# Patient Record
Sex: Male | Born: 1965 | Race: Black or African American | Hispanic: No | Marital: Single | Smoking: Never smoker
Health system: Southern US, Community
[De-identification: ages and names within clinical notes are randomized; demographics above are authoritative.]

## PROBLEM LIST (undated history)

## (undated) DIAGNOSIS — Z8619 Personal history of other infectious and parasitic diseases: Secondary | ICD-10-CM

## (undated) DIAGNOSIS — F419 Anxiety disorder, unspecified: Secondary | ICD-10-CM

## (undated) DIAGNOSIS — K5190 Ulcerative colitis, unspecified, without complications: Secondary | ICD-10-CM

## (undated) DIAGNOSIS — R011 Cardiac murmur, unspecified: Secondary | ICD-10-CM

## (undated) DIAGNOSIS — R51 Headache: Secondary | ICD-10-CM

## (undated) DIAGNOSIS — F32A Depression, unspecified: Secondary | ICD-10-CM

## (undated) DIAGNOSIS — B20 Human immunodeficiency virus [HIV] disease: Secondary | ICD-10-CM

## (undated) DIAGNOSIS — N529 Male erectile dysfunction, unspecified: Secondary | ICD-10-CM

## (undated) DIAGNOSIS — E785 Hyperlipidemia, unspecified: Secondary | ICD-10-CM

## (undated) DIAGNOSIS — J302 Other seasonal allergic rhinitis: Secondary | ICD-10-CM

## (undated) DIAGNOSIS — F329 Major depressive disorder, single episode, unspecified: Secondary | ICD-10-CM

## (undated) DIAGNOSIS — I10 Essential (primary) hypertension: Secondary | ICD-10-CM

## (undated) DIAGNOSIS — R519 Headache, unspecified: Secondary | ICD-10-CM

## (undated) DIAGNOSIS — I639 Cerebral infarction, unspecified: Secondary | ICD-10-CM

## (undated) DIAGNOSIS — Z21 Asymptomatic human immunodeficiency virus [HIV] infection status: Secondary | ICD-10-CM

## (undated) HISTORY — DX: Headache, unspecified: R51.9

## (undated) HISTORY — DX: Depression, unspecified: F32.A

## (undated) HISTORY — DX: Human immunodeficiency virus (HIV) disease: B20

## (undated) HISTORY — PX: HERNIA REPAIR: SHX51

## (undated) HISTORY — DX: Hyperlipidemia, unspecified: E78.5

## (undated) HISTORY — DX: Headache: R51

## (undated) HISTORY — DX: Other seasonal allergic rhinitis: J30.2

## (undated) HISTORY — DX: Asymptomatic human immunodeficiency virus (hiv) infection status: Z21

## (undated) HISTORY — DX: Ulcerative colitis, unspecified, without complications: K51.90

## (undated) HISTORY — DX: Male erectile dysfunction, unspecified: N52.9

## (undated) HISTORY — DX: Personal history of other infectious and parasitic diseases: Z86.19

## (undated) HISTORY — DX: Cardiac murmur, unspecified: R01.1

## (undated) HISTORY — PX: INGUINAL HERNIA REPAIR: SUR1180

## (undated) HISTORY — DX: Anxiety disorder, unspecified: F41.9

## (undated) HISTORY — PX: ELBOW SURGERY: SHX618

## (undated) HISTORY — DX: Essential (primary) hypertension: I10

## (undated) HISTORY — DX: Major depressive disorder, single episode, unspecified: F32.9

## (undated) HISTORY — DX: Cerebral infarction, unspecified: I63.9

---

## 2004-08-29 DIAGNOSIS — B20 Human immunodeficiency virus [HIV] disease: Secondary | ICD-10-CM | POA: Insufficient documentation

## 2004-08-29 DIAGNOSIS — Z21 Asymptomatic human immunodeficiency virus [HIV] infection status: Secondary | ICD-10-CM | POA: Insufficient documentation

## 2012-05-29 DIAGNOSIS — I10 Essential (primary) hypertension: Secondary | ICD-10-CM | POA: Insufficient documentation

## 2012-05-29 DIAGNOSIS — K5190 Ulcerative colitis, unspecified, without complications: Secondary | ICD-10-CM | POA: Insufficient documentation

## 2014-03-15 DIAGNOSIS — Z8673 Personal history of transient ischemic attack (TIA), and cerebral infarction without residual deficits: Secondary | ICD-10-CM | POA: Insufficient documentation

## 2014-12-21 ENCOUNTER — Emergency Department: Payer: Self-pay | Admitting: Emergency Medicine

## 2015-10-09 DIAGNOSIS — E785 Hyperlipidemia, unspecified: Secondary | ICD-10-CM | POA: Insufficient documentation

## 2016-03-26 ENCOUNTER — Encounter: Payer: Self-pay | Admitting: Family Medicine

## 2016-03-26 ENCOUNTER — Ambulatory Visit (INDEPENDENT_AMBULATORY_CARE_PROVIDER_SITE_OTHER): Payer: Self-pay | Admitting: Family Medicine

## 2016-03-26 VITALS — BP 145/94 | HR 80 | Temp 98.8°F | Resp 16 | Ht 70.0 in | Wt 144.0 lb

## 2016-03-26 DIAGNOSIS — Z21 Asymptomatic human immunodeficiency virus [HIV] infection status: Secondary | ICD-10-CM

## 2016-03-26 DIAGNOSIS — B20 Human immunodeficiency virus [HIV] disease: Secondary | ICD-10-CM

## 2016-03-26 DIAGNOSIS — I10 Essential (primary) hypertension: Secondary | ICD-10-CM | POA: Diagnosis not present

## 2016-03-26 DIAGNOSIS — Z7689 Persons encountering health services in other specified circumstances: Secondary | ICD-10-CM

## 2016-03-26 DIAGNOSIS — K5190 Ulcerative colitis, unspecified, without complications: Secondary | ICD-10-CM | POA: Diagnosis not present

## 2016-03-26 DIAGNOSIS — F329 Major depressive disorder, single episode, unspecified: Secondary | ICD-10-CM

## 2016-03-26 DIAGNOSIS — M722 Plantar fascial fibromatosis: Secondary | ICD-10-CM

## 2016-03-26 DIAGNOSIS — R0789 Other chest pain: Secondary | ICD-10-CM

## 2016-03-26 DIAGNOSIS — G44229 Chronic tension-type headache, not intractable: Secondary | ICD-10-CM | POA: Diagnosis not present

## 2016-03-26 DIAGNOSIS — F32A Depression, unspecified: Secondary | ICD-10-CM

## 2016-03-26 DIAGNOSIS — F418 Other specified anxiety disorders: Secondary | ICD-10-CM | POA: Diagnosis not present

## 2016-03-26 DIAGNOSIS — Z7189 Other specified counseling: Secondary | ICD-10-CM

## 2016-03-26 DIAGNOSIS — E785 Hyperlipidemia, unspecified: Secondary | ICD-10-CM | POA: Diagnosis not present

## 2016-03-26 DIAGNOSIS — N529 Male erectile dysfunction, unspecified: Secondary | ICD-10-CM | POA: Insufficient documentation

## 2016-03-26 DIAGNOSIS — Z8673 Personal history of transient ischemic attack (TIA), and cerebral infarction without residual deficits: Secondary | ICD-10-CM | POA: Diagnosis not present

## 2016-03-26 DIAGNOSIS — F419 Anxiety disorder, unspecified: Secondary | ICD-10-CM

## 2016-03-26 DIAGNOSIS — R519 Headache, unspecified: Secondary | ICD-10-CM | POA: Insufficient documentation

## 2016-03-26 DIAGNOSIS — R51 Headache: Secondary | ICD-10-CM

## 2016-03-26 MED ORDER — NAPROXEN 375 MG PO TABS: 375.0000 mg | ORAL_TABLET | Freq: Two times a day (BID) | ORAL | Status: DC

## 2016-03-26 MED ORDER — CITALOPRAM HYDROBROMIDE 20 MG PO TABS: 20.0000 mg | ORAL_TABLET | Freq: Every day | ORAL | Status: AC

## 2016-03-26 MED ORDER — LOSARTAN POTASSIUM-HCTZ 50-12.5 MG PO TABS: 1.0000 | ORAL_TABLET | Freq: Every day | ORAL | Status: DC

## 2016-03-26 NOTE — Assessment & Plan Note (Signed)
Secondary prevention ASA and statin. No residual. Does not follow with neurology.

## 2016-03-26 NOTE — Assessment & Plan Note (Signed)
HA consistent with tension type. PRN aleve. Ideally with decrease with stress relief. Reviewed HA alarm symptoms. Recheck 1 mos.

## 2016-03-26 NOTE — Assessment & Plan Note (Signed)
Continue atorvastatin. Check lipid panel. Continue 325mg  aspirin for secondary prevention.

## 2016-03-26 NOTE — Assessment & Plan Note (Signed)
Elevated. Check CMET. Consider adding ACE for renal protection and better blood pressure control. Continue to check at home.

## 2016-03-26 NOTE — Progress Notes (Signed)
Subjective:    Patient ID: Brandon Mullins, male    DOB: [birth year], 50 y.o.   MRN: [id number]  HPI: Brandon Mullins is a 50 y.o. male presenting on 03/26/2016 for New Patient (Initial Visit); Hypertension; and Headache   HPI  Pt presents to establish care today. Previous care provider was Dr. Carroll [facility]. Other physicians include Dr. Sedalia Muta at [facility] in [place].   It has been 7 years since His last PCP visit. Records from previous provider will be requested and reviewed. Current medical problems include: HIV takes Sustiva-  once daily. Managed by [facility].  Personal history of TIA- 2 years ago on the way to work. L occipital and thalamic infarct. Takes aspirin  daily for prevention. Was placed on atorvastatin-  daily. No residual.  Hyperlipidemia: Goal LDL is 70. Taking atorvastatin daily.  Plantar fasciitis- painful stepping down in the AM.  HTN: Currently on amlodipine and HCTZ 12.5mg - on for 5 years. No chest pain or shortness of breath. Strong family history. Checks BP at home- avg at home 140/80-90. Gets HA when BP is high.  Frequent HA-  Frontal and occipital HA. Symptoms began 2-3 years ago. Getting HA more frequently. Job is stressful. Pain is described as ache and throb. No nausea or sensitivity to light/sound. Usually occur at the end of work day. Home treatment: Aleve- provides relief. HA occur 3x per week. Does not happen on weekends.  Depression: Mother passed in September. Stressful job. Stress has increased at work after mother's passing. Has been on citalopram since the strokes.   Seen in March at [facility] for chest pain. Having L sided chest pain radiating. [facility]- r/o MI.  Thought to be stress and fatigue. Had some pains yesterday. Occurred when walking. Felt a squeezing pain.  Health maintenance:  Works as Patent examiner at [employer] Last Colonoscopy- had one for UC- found benign polyps- 2011. Told to proceed with normal.  No family history of prostate cancer. Would like to  screen PSA.     Past Medical History  Diagnosis Date  . Frequent headaches   . Hyperlipidemia   . Hypertension   . History of chicken pox   . Stroke (HCC)     x2  . HIV infection (HCC)   . Erectile dysfunction   . Ulcerative colitis (HCC)   . Seasonal allergies    Social History   Social History  . Marital Status: Unknown    Spouse Name: N/A  . Number of Children: N/A  . Years of Education: N/A   Occupational History  . Not on file.   Social History Main Topics  . Smoking status: Never Smoker   . Smokeless tobacco: Not on file  . Alcohol Use: 0.0 oz/week    0 Standard drinks or equivalent per week     Comment: occasional  . Drug Use: No  . Sexual Activity: Not on file   Other Topics Concern  . Not on file   Social History Narrative  . No narrative on file   No family history on file. No current outpatient prescriptions on file prior to visit.   No current facility-administered medications on file prior to visit.    Review of Systems  Constitutional: Negative for fever and chills.  Respiratory: Negative for chest tightness, shortness of breath and wheezing.   Cardiovascular: Positive for chest pain. Negative for palpitations and leg swelling.  Gastrointestinal: Negative for nausea, vomiting and abdominal pain.  Endocrine: Negative.   Genitourinary:  Negative for dysuria, urgency, discharge, penile pain and testicular pain.  Musculoskeletal: Positive for arthralgias (heel pain). Negative for back pain and joint swelling.  Skin: Negative.   Neurological: Positive for headaches. Negative for dizziness, weakness and numbness.  Psychiatric/Behavioral: Positive for dysphoric mood. Negative for suicidal ideas and sleep disturbance.   Per HPI unless specifically indicated above     Objective:    BP 145/94 mmHg  Pulse 80  Temp(Src) 98.8 F (37.1 C) (Oral)  Resp 16  Ht 5\' 10"  (1.778 m)  Wt 144 lb (65.318 kg)  BMI 20.66 kg/m2  Wt Readings from Last 3  Encounters:  03/26/16 144 lb (65.318 kg)    Depression screen [facility] 03/26/2016 03/26/2016  Decreased Interest 0 0  Down, Depressed, Hopeless 2 0  PHQ - 2 Score 2 0  Altered sleeping 1 -  Tired, decreased energy 3 -  Change in appetite 0 -  Feeling bad or failure about yourself  2 -  Trouble concentrating 0 -  Moving slowly or fidgety/restless 1 -  Suicidal thoughts 0 -  PHQ-9 Score 9 -  Difficult doing work/chores Somewhat difficult -     Physical Exam  Constitutional: He is oriented to person, place, and time. He appears well-developed and well-nourished. No distress.  HENT:  Head: Normocephalic and atraumatic.  Neck: Neck supple. No thyromegaly present.  Cardiovascular: Normal rate, regular rhythm and normal heart sounds.  Exam reveals no gallop and no friction rub.   No murmur heard. Pulmonary/Chest: Effort normal and breath sounds normal. He has no wheezes.  Abdominal: Soft. Bowel sounds are normal. He exhibits no distension. There is no tenderness. There is no rebound.  Musculoskeletal: Normal range of motion. He exhibits no edema.       Left foot: There is tenderness (at plantar fascia insertion. ).  Neurological: He is alert and oriented to person, place, and time. He has normal reflexes.  Skin: Skin is warm and dry. No rash noted. No erythema.  Psychiatric: He has a normal mood and affect. His behavior is normal. Thought content normal.   No results found for this or any previous visit.    Assessment & Plan:   Problem List Items Addressed This Visit      Cardiovascular and Mediastinum   BP (high blood pressure)    Elevated. Check CMET. Consider adding ACE for renal protection and better blood pressure control. Continue to check at home.       Relevant Medications   amLODipine (NORVASC) 10 MG tablet   atorvastatin (LIPITOR) 20 MG tablet   hydrochlorothiazide (HYDRODIURIL) 12.5 MG tablet   aspirin EC 325 MG tablet   losartan-hydrochlorothiazide (HYZAAR) 50-12.5  MG tablet     Digestive   Ulcerative colitis (HCC)    Does not have a GI specialist. No symptoms in many years. Doing well with suppositories.         Nervous and Auditory   Tension headache, chronic    HA consistent with tension type. PRN aleve. Ideally with decrease with stress relief. Reviewed HA alarm symptoms. Recheck 1 mos.       Relevant Medications   amLODipine (NORVASC) 10 MG tablet   aspirin EC 325 MG tablet   citalopram (CELEXA) 20 MG tablet   naproxen (NAPROSYN) 375 MG tablet     Musculoskeletal and Integument   Plantar fasciitis    Gentle stretching and PRN naproxen. Consider PT is not improving.       Relevant Medications  naproxen (NAPROSYN) 375 MG tablet     Other   Human immunodeficiency virus (HIV) infection (HCC)    Managed by [facility]. Follows q6 mos.       Relevant Medications   EPZICOM 600-300 MG tablet   SUSTIVA 600 MG tablet   HLD (hyperlipidemia)    Continue atorvastatin. Check lipid panel. Continue  aspirin for secondary prevention.       Relevant Medications   amLODipine (NORVASC) 10 MG tablet   atorvastatin (LIPITOR) 20 MG tablet   hydrochlorothiazide (HYDRODIURIL) 12.5 MG tablet   aspirin EC 325 MG tablet   losartan-hydrochlorothiazide (HYZAAR) 50-12.5 MG tablet   Other Relevant Orders   Lipid Profile   History of ischemic stroke without residual deficits    Secondary prevention ASA and statin. No residual. Does not follow with neurology.       Anxiety and depression    Increase celexa dose to  daily. Recheck 1mos. Check TSH, B12, and vitamin D.       Relevant Medications   citalopram (CELEXA) 20 MG tablet   Other Relevant Orders   VITAMIN D 25 Hydroxy (Vit-D Deficiency, Fractures)   TSH   B12 and Folate Panel    Other Visit Diagnoses    Encounter to establish care    -  Primary    Chest pressure        ECG WNL. Cardiac issues r/o at [facility] in March. On Statin and aspirin. Pt to ER if occurs again. Consider cardiology  if recurrent.     Relevant Orders    EKG 12-Lead    Comprehensive metabolic panel       Meds ordered this encounter  Medications  . amLODipine (NORVASC) 10 MG tablet    Sig: Take 10 mg by mouth daily.    Refill:  11  . atorvastatin (LIPITOR) 20 MG tablet    Sig: Take 20 mg by mouth daily.    Refill:  3  . DISCONTD: citalopram (CELEXA) 10 MG tablet    Sig: Take 10 mg by mouth daily.    Refill:  3  . EPZICOM 600-300 MG tablet    Sig: Take 1 tablet by mouth daily.    Refill:  3  . hydrochlorothiazide (HYDRODIURIL) 12.5 MG tablet    Sig: Take 1 tablet by mouth daily.    Refill:  3  . SUSTIVA 600 MG tablet    Sig: Take 1 tablet by mouth daily.    Refill:  3  . hydrocortisone (ANUSOL-HC) 25 MG suppository    Sig: Place 1 suppository rectally 2 (two) times daily as needed.  . hydrOXYzine (ATARAX/VISTARIL) 25 MG tablet    Sig: Take 25 mg by mouth daily.  Marland Kitchen aspirin EC 325 MG tablet    Sig: Take 325 mg by mouth daily.  Marland Kitchen triamcinolone cream (KENALOG) 0.1 %    Sig: Apply 1 application topically 2 (two) times daily.  Marland Kitchen DISCONTD: fexofenadine-pseudoephedrine (ALLEGRA-D 24) 180-240 MG 24 hr tablet    Sig: Take 1 tablet by mouth daily as needed. Reported on 03/26/2016  . fluticasone (FLONASE) 50 MCG/ACT nasal spray    Sig: Place 2 sprays into the nose as needed.  Marland Kitchen DISCONTD: benzonatate (TESSALON) 100 MG capsule    Sig: Take 100 mg by mouth 3 (three) times daily as needed for cough.  . losartan-hydrochlorothiazide (HYZAAR) 50-12.5 MG tablet    Sig: Take 1 tablet by mouth daily.    Dispense:  30 tablet    Refill:  11    Order Specific Question:  Supervising Provider    Answer:  Janeann Forehand [[id number]]  . citalopram (CELEXA) 20 MG tablet    Sig: Take 1 tablet (20 mg total) by mouth daily.    Dispense:  30 tablet    Refill:  11    Order Specific Question:  Supervising Provider    Answer:  Janeann Forehand [phone]  . naproxen (NAPROSYN) 375 MG tablet    Sig: Take 1 tablet  (375 mg total) by mouth 2 (two) times daily with a meal.    Dispense:  30 tablet    Refill:  1    Order Specific Question:  Supervising Provider    Answer:  Janeann Forehand [[id number]]      Follow up plan: Return in about 4 weeks (around 04/23/2016) for BP.

## 2016-03-26 NOTE — Assessment & Plan Note (Signed)
Gentle stretching and PRN naproxen. Consider PT is not improving.

## 2016-03-26 NOTE — Assessment & Plan Note (Signed)
Increase celexa dose to 20mg  daily. Recheck 1mos. Check TSH, B12, and vitamin D.

## 2016-03-26 NOTE — Assessment & Plan Note (Signed)
Managed by [facility]. Follows q6 mos.

## 2016-03-26 NOTE — Patient Instructions (Addendum)
We will increase your blood pressure medication today.  I want you to take Losartan-HCTZ 50-12.5mg  once daily to help with your blood pressure. Continue to check it at home. Your goal blood pressure is 100/60 to 140/90 Work on low salt/sodium diet - goal <1.5gm (1,500mg ) per day. Eat a diet high in fruits/vegetables and whole grains.  Look into mediterranean and DASH diet. Goal activity is 13350min/wk of moderate intensity exercise.  This can be split into 30 minute chunks.  If you are not at this level, you can start with smaller 10-15 min increments and slowly build up activity. Look at www.heart.org for more resources Please seek immediate medical attention at ER or Urgent Care if you develop: Chest pain, pressure or tightness. Shortness of breath accompanied by nausea or diaphoresis Visual changes Numbness or tingling on one side of the body Facial droop Altered mental status Or any concerning symptoms.  Let's try increasing your dose of Celexa to 20mg  daily to help with stress. Just take 2 pills of current until bottle is empty.

## 2016-03-26 NOTE — Assessment & Plan Note (Signed)
Does not have a GI specialist. No symptoms in many years. Doing well with suppositories.

## 2016-04-08 LAB — COMPREHENSIVE METABOLIC PANEL
ALT: 21 IU/L (ref 0–44)
AST: 21 IU/L (ref 0–40)
Albumin/Globulin Ratio: 2 (ref 1.2–2.2)
Albumin: 4.6 g/dL (ref 3.5–5.5)
Alkaline Phosphatase: 78 IU/L (ref 39–117)
BUN/Creatinine Ratio: 10 (ref 9–20)
BUN: 12 mg/dL (ref 6–24)
Bilirubin Total: 0.2 mg/dL (ref 0.0–1.2)
CALCIUM: 9.8 mg/dL (ref 8.7–10.2)
CO2: 26 mmol/L (ref 18–29)
CREATININE: 1.2 mg/dL (ref 0.76–1.27)
Chloride: 101 mmol/L (ref 96–106)
GFR calc Af Amer: 82 mL/min/{1.73_m2} (ref >59)
GFR, EST NON AFRICAN AMERICAN: 71 mL/min/{1.73_m2} (ref >59)
GLOBULIN, TOTAL: 2.3 g/dL (ref 1.5–4.5)
Glucose: 78 mg/dL (ref 65–99)
Potassium: 4.3 mmol/L (ref 3.5–5.2)
Sodium: 145 mmol/L — ABNORMAL HIGH (ref 134–144)
Total Protein: 6.9 g/dL (ref 6.0–8.5)

## 2016-04-08 LAB — B12 AND FOLATE PANEL
Folate: 4 ng/mL (ref >3.0)
Vitamin B-12: 276 pg/mL (ref 211–946)

## 2016-04-08 LAB — LIPID PANEL
CHOL/HDL RATIO: 2.8 ratio (ref 0.0–5.0)
Cholesterol, Total: 171 mg/dL (ref 100–199)
HDL: 61 mg/dL (ref >39)
LDL CALC: 57 mg/dL (ref 0–99)
TRIGLYCERIDES: 263 mg/dL — AB (ref 0–149)
VLDL CHOLESTEROL CAL: 53 mg/dL — AB (ref 5–40)

## 2016-04-08 LAB — TSH: TSH: 1.18 u[IU]/mL (ref 0.450–4.500)

## 2016-04-08 LAB — VITAMIN D 25 HYDROXY (VIT D DEFICIENCY, FRACTURES): Vit D, 25-Hydroxy: 36.3 ng/mL (ref 30.0–100.0)

## 2016-04-22 ENCOUNTER — Ambulatory Visit (INDEPENDENT_AMBULATORY_CARE_PROVIDER_SITE_OTHER): Payer: BLUE CROSS/BLUE SHIELD | Admitting: Family Medicine

## 2016-04-22 ENCOUNTER — Encounter: Payer: Self-pay | Admitting: Family Medicine

## 2016-04-22 VITALS — BP 122/94 | HR 91 | Temp 98.9°F | Resp 16 | Ht 70.0 in | Wt 147.0 lb

## 2016-04-22 DIAGNOSIS — I10 Essential (primary) hypertension: Secondary | ICD-10-CM | POA: Diagnosis not present

## 2016-04-22 DIAGNOSIS — Z8673 Personal history of transient ischemic attack (TIA), and cerebral infarction without residual deficits: Secondary | ICD-10-CM | POA: Diagnosis not present

## 2016-04-22 DIAGNOSIS — R0789 Other chest pain: Secondary | ICD-10-CM | POA: Insufficient documentation

## 2016-04-22 DIAGNOSIS — J069 Acute upper respiratory infection, unspecified: Secondary | ICD-10-CM | POA: Diagnosis not present

## 2016-04-22 MED ORDER — OXYMETAZOLINE HCL 0.05 % NA SOLN: 2.0000 | Freq: Two times a day (BID) | NASAL | Status: DC

## 2016-04-22 MED ORDER — DM-GUAIFENESIN ER 30-600 MG PO TB12: 1.0000 | ORAL_TABLET | Freq: Two times a day (BID) | ORAL | Status: DC

## 2016-04-22 MED ORDER — CHLORPHEN-PE-ACETAMINOPHEN 2-5-325 MG PO TABS: 1.0000 | ORAL_TABLET | Freq: Four times a day (QID) | ORAL | Status: DC | PRN

## 2016-04-22 NOTE — Assessment & Plan Note (Signed)
Diastolic still out of goal- will increase HCTZ to 25mg  total before changing combo pill. Check K and BMET next visit. Recheck 1 mos.

## 2016-04-22 NOTE — Progress Notes (Signed)
Subjective:    Patient ID: Brandon Mullins, male    DOB: [birth year], 50 y.o.   MRN: [id number]  HPI: Brandon Mullins is a 50 y.o. male presenting on 04/22/2016 for Hypertension   HPI  NWG:NFAOZH Bp meds at home, Norvasc and hyzaar- checks BP at night. Elevated at night 130/90. No chest pain. Had episode of chest discomfort on recent cruise- L sided chest pain before eating. Took naproxen and it went away. Lasted about 15 minutes-pressure in the chest that radiated to the neck. Had similar episode in March- seen at [facility]. This is 3rd episode.  Drainage x4 days. Returned from cruise. Laryngitis. No congestion. No sinus pressure. Scratchy throat. Cough to clear throat- no productive cough. Home mucinex, day quil/nyquil- mild relief.  HA- tension last night relieved with naproxen and massage.  Doing better in increased dose of celexa. Feels his symptoms are much better controlled.   Past Medical History  Diagnosis Date  . Frequent headaches   . Hyperlipidemia   . Hypertension   . History of chicken pox   . Stroke (HCC)     x2  . HIV infection (HCC)   . Erectile dysfunction   . Ulcerative colitis (HCC)   . Seasonal allergies     Current Outpatient Prescriptions on File Prior to Visit  Medication Sig  . amLODipine (NORVASC) 10 MG tablet Take 10 mg by mouth daily.  Marland Kitchen aspirin EC 325 MG tablet Take 325 mg by mouth daily.  Marland Kitchen atorvastatin (LIPITOR) 20 MG tablet Take 20 mg by mouth daily.  . citalopram (CELEXA) 20 MG tablet Take 1 tablet (20 mg total) by mouth daily.  Marland Kitchen EPZICOM 600-300 MG tablet Take 1 tablet by mouth daily.  . fluticasone (FLONASE) 50 MCG/ACT nasal spray Place 2 sprays into the nose as needed.  . hydrochlorothiazide (HYDRODIURIL) 12.5 MG tablet Take 1 tablet by mouth daily.  . hydrocortisone (ANUSOL-HC) 25 MG suppository Place 1 suppository rectally 2 (two) times daily as needed.  . hydrOXYzine (ATARAX/VISTARIL) 25 MG tablet Take 25 mg by mouth daily.  Marland Kitchen  losartan-hydrochlorothiazide (HYZAAR) 50-12.5 MG tablet Take 1 tablet by mouth daily.  . naproxen (NAPROSYN) 375 MG tablet Take 1 tablet (375 mg total) by mouth 2 (two) times daily with a meal.  . SUSTIVA 600 MG tablet Take 1 tablet by mouth daily.  Marland Kitchen triamcinolone cream (KENALOG) 0.1 % Apply 1 application topically 2 (two) times daily.   No current facility-administered medications on file prior to visit.    Review of Systems  Constitutional: Negative for fever and chills.  HENT: Positive for congestion, postnasal drip and sore throat. Negative for dental problem, ear pain, hearing loss and trouble swallowing.   Respiratory: Negative for chest tightness, shortness of breath and wheezing.   Cardiovascular: Positive for chest pain. Negative for palpitations and leg swelling.  Gastrointestinal: Negative for nausea, vomiting and abdominal pain.  Endocrine: Negative.   Genitourinary: Negative for dysuria, urgency, discharge, penile pain and testicular pain.  Musculoskeletal: Negative for back pain, joint swelling and arthralgias.  Skin: Negative.   Neurological: Positive for headaches. Negative for dizziness, weakness and numbness.  Psychiatric/Behavioral: Negative for sleep disturbance and dysphoric mood.   Per HPI unless specifically indicated above     Objective:    BP 122/94 mmHg  Pulse 91  Temp(Src) 98.9 F (37.2 C) (Oral)  Resp 16  Ht 5\' 10"  (1.778 m)  Wt 147 lb (66.679 kg)  BMI 21.09 kg/m2  Wt Readings from  Last 3 Encounters:  04/22/16 147 lb (66.679 kg)  03/26/16 144 lb (65.318 kg)    Physical Exam  Constitutional: He is oriented to person, place, and time. He appears well-developed and well-nourished. No distress.  HENT:  Head: Normocephalic and atraumatic.  Right Ear: Hearing and tympanic membrane normal.  Left Ear: Hearing and tympanic membrane normal.  Nose: Mucosal edema present. Right sinus exhibits no maxillary sinus tenderness and no frontal sinus tenderness.  Left sinus exhibits no maxillary sinus tenderness and no frontal sinus tenderness.  Mouth/Throat: Uvula is midline and mucous membranes are normal. Posterior oropharyngeal erythema present.  Oropharyngeal erythema and post nasal drainage seen.   Neck: Neck supple. No thyromegaly present.  Cardiovascular: Normal rate, regular rhythm and normal heart sounds.  Exam reveals no gallop and no friction rub.   No murmur heard. Pulmonary/Chest: Effort normal and breath sounds normal. He has no wheezes.  Abdominal: Soft. Bowel sounds are normal. He exhibits no distension. There is no tenderness. There is no rebound.  Musculoskeletal: Normal range of motion. He exhibits no edema or tenderness.  Neurological: He is alert and oriented to person, place, and time. He has normal reflexes.  Skin: Skin is warm and dry. No rash noted. No erythema.  Psychiatric: He has a normal mood and affect. His behavior is normal. Thought content normal.   Results for orders placed or performed in visit on 03/26/16  Comprehensive metabolic panel  Result Value Ref Range   Glucose 78 65 - 99 mg/dL   BUN 12 6 - 24 mg/dL   Creatinine, Ser 1.61 0.76 - 1.27 mg/dL   GFR calc non Af Amer 71 >59 mL/min/1.73   GFR calc Af Amer 82 >59 mL/min/1.73   BUN/Creatinine Ratio 10 9 - 20   Sodium 145 (H) 134 - 144 mmol/L   Potassium 4.3 3.5 - 5.2 mmol/L   Chloride 101 96 - 106 mmol/L   CO2 26 18 - 29 mmol/L   Calcium 9.8 8.7 - 10.2 mg/dL   Total Protein 6.9 6.0 - 8.5 g/dL   Albumin 4.6 3.5 - 5.5 g/dL   Globulin, Total 2.3 1.5 - 4.5 g/dL   Albumin/Globulin Ratio 2.0 1.2 - 2.2   Bilirubin Total 0.2 0.0 - 1.2 mg/dL   Alkaline Phosphatase 78 39 - 117 IU/L   AST 21 0 - 40 IU/L   ALT 21 0 - 44 IU/L  Lipid Profile  Result Value Ref Range   Cholesterol, Total 171 100 - 199 mg/dL   Triglycerides 096 (H) 0 - 149 mg/dL   HDL 61 >04 mg/dL   VLDL Cholesterol Cal 53 (H) 5 - 40 mg/dL   LDL Calculated 57 0 - 99 mg/dL   Chol/HDL Ratio 2.8 0.0  - 5.0 ratio units  VITAMIN D 25 Hydroxy (Vit-D Deficiency, Fractures)  Result Value Ref Range   Vit D, 25-Hydroxy 36.3 30.0 - 100.0 ng/mL  TSH  Result Value Ref Range   TSH 1.180 0.450 - 4.500 uIU/mL  B12 and Folate Panel  Result Value Ref Range   Vitamin B-12 276 211 - 946 pg/mL   Folate 4.0 >3.0 ng/mL      Assessment & Plan:   Problem List Items Addressed This Visit      Cardiovascular and Mediastinum   BP (high blood pressure) - Primary    Diastolic still out of goal- will increase HCTZ to  total before changing combo pill. Check K and BMET next visit. Recheck 1 mos.  Other   History of ischemic stroke without residual deficits   Relevant Orders   Ambulatory referral to Cardiology   Chest tightness or pressure    Due to multiple episodes of CP- will have patient seen by cardiology given h/o stroke. Pt to ER if chest pain occurs again.       Relevant Orders   Ambulatory referral to Cardiology    Other Visit Diagnoses    Upper respiratory infection        Add flonase daily. Continue mucinex. Cautioned for sparing use of decongestants due to his HTN. Supportive care at home. Reviewed alarm symptoms. Return if not improving.     Relevant Medications    Chlorphen-Phenyleph-APAP 2-5-325 MG TABS    oxymetazoline (AFRIN NASAL SPRAY) 0.05 % nasal spray    dextromethorphan-guaiFENesin (MUCINEX DM) 30-600 MG 12hr tablet       Meds ordered this encounter  Medications  . Chlorphen-Phenyleph-APAP 2-5-325 MG TABS    Sig: Take 1 tablet by mouth every 6 (six) hours as needed.    Dispense:  80 each    Refill:  0    Order Specific Question:  Supervising Provider    Answer:  Janeann ForehandHAWKINS JR, JAMES H [phone][[id number]]  . oxymetazoline (AFRIN NASAL SPRAY) 0.05 % nasal spray    Sig: Place 2 sprays into both nostrils 2 (two) times daily. For 3 days only.    Dispense:  30 mL    Refill:  0    Order Specific Question:  Supervising Provider    Answer:  Janeann ForehandHAWKINS JR, JAMES H [[id number]]  .  dextromethorphan-guaiFENesin (MUCINEX DM) 30-600 MG 12hr tablet    Sig: Take 1 tablet by mouth 2 (two) times daily.    Dispense:  20 tablet    Refill:  0    Order Specific Question:  Supervising Provider    Answer:  Janeann ForehandHAWKINS JR, JAMES H [[id number]]      Follow up plan: Return in about 4 weeks (around 05/20/2016), or if symptoms worsen or fail to improve, for BP check..Marland Kitchen

## 2016-04-22 NOTE — Assessment & Plan Note (Signed)
Due to multiple episodes of CP- will have patient seen by cardiology given h/o stroke. Pt to ER if chest pain occurs again.

## 2016-04-22 NOTE — Patient Instructions (Addendum)
We will increase your HCTZ to a total of 25mg  once daily. Take your Hyazaar pill and the 12.5mg  HCTZ pill at home once daily.  We will recheck blood pressure in 4 weeks.   I think we should have cardiology take a look at you due to chest pain and pressure since it keeps occurring. I will place a referral today. IF you have chest pressure again- please be seen in ER.   Your symptoms are consistent with a viral upper respiratory infection. At this time there is no need for antibiotics.  If your symptoms persist for > 10 days or get better and than worsen please let me know. You may have a secondary bacterial infection.  You can use supportive care at home to help with your symptoms. I have sent Mucinex DM to your pharmacy to help break up the congestion and soothe your cough. You can takes this twice daily.   Honey is a natural cough suppressant- so add it to your tea in the morning.  If you have a humidifer, set that up in your bedroom at night. Use afrin or tylenol sinus as needed to dry up drainage and congestion. Take your flonase daily.

## 2016-05-10 ENCOUNTER — Encounter: Payer: Self-pay | Admitting: *Deleted

## 2016-05-10 ENCOUNTER — Ambulatory Visit: Payer: Self-pay | Admitting: Cardiology

## 2016-05-21 ENCOUNTER — Ambulatory Visit: Payer: BLUE CROSS/BLUE SHIELD | Admitting: Family Medicine

## 2016-07-13 ENCOUNTER — Encounter: Payer: Self-pay | Admitting: Family Medicine

## 2016-07-13 ENCOUNTER — Ambulatory Visit (INDEPENDENT_AMBULATORY_CARE_PROVIDER_SITE_OTHER): Payer: BLUE CROSS/BLUE SHIELD | Admitting: Family Medicine

## 2016-07-13 VITALS — BP 134/99 | HR 89 | Temp 98.6°F | Resp 16 | Ht 70.0 in | Wt 144.6 lb

## 2016-07-13 DIAGNOSIS — I10 Essential (primary) hypertension: Secondary | ICD-10-CM | POA: Diagnosis not present

## 2016-07-13 DIAGNOSIS — Z8673 Personal history of transient ischemic attack (TIA), and cerebral infarction without residual deficits: Secondary | ICD-10-CM

## 2016-07-13 DIAGNOSIS — G44221 Chronic tension-type headache, intractable: Secondary | ICD-10-CM

## 2016-07-13 DIAGNOSIS — R202 Paresthesia of skin: Secondary | ICD-10-CM

## 2016-07-13 DIAGNOSIS — R0789 Other chest pain: Secondary | ICD-10-CM | POA: Diagnosis not present

## 2016-07-13 LAB — BASIC METABOLIC PANEL WITH GFR
BUN: 15 mg/dL (ref 7–25)
CALCIUM: 9.7 mg/dL (ref 8.6–10.3)
CO2: 26 mmol/L (ref 20–31)
CREATININE: 0.99 mg/dL (ref 0.60–1.35)
Chloride: 101 mmol/L (ref 98–110)
GFR, Est African American: >89 mL/min (ref >=60)
GFR, Est Non African American: >89 mL/min (ref >=60)
GLUCOSE: 101 mg/dL — AB (ref 65–99)
Potassium: 4.7 mmol/L (ref 3.5–5.3)
Sodium: 138 mmol/L (ref 135–146)

## 2016-07-13 LAB — CBC WITH DIFFERENTIAL/PLATELET
BASOS ABS: 96 {cells}/uL (ref 0–200)
Basophils Relative: 1 %
EOS ABS: 192 {cells}/uL (ref 15–500)
Eosinophils Relative: 2 %
HEMATOCRIT: 42.9 % (ref 38.5–50.0)
HEMOGLOBIN: 14.7 g/dL (ref 13.2–17.1)
LYMPHS ABS: 2400 {cells}/uL (ref 850–3900)
Lymphocytes Relative: 25 %
MCH: 31.3 pg (ref 27.0–33.0)
MCHC: 34.3 g/dL (ref 32.0–36.0)
MCV: 91.5 fL (ref 80.0–100.0)
MPV: 9.8 fL (ref 7.5–12.5)
Monocytes Absolute: 960 cells/uL — ABNORMAL HIGH (ref 200–950)
Monocytes Relative: 10 %
NEUTROS ABS: 5952 {cells}/uL (ref 1500–7800)
Neutrophils Relative %: 62 %
Platelets: 338 10*3/uL (ref 140–400)
RBC: 4.69 MIL/uL (ref 4.20–5.80)
RDW: 14 % (ref 11.0–15.0)
WBC: 9.6 10*3/uL (ref 3.8–10.8)

## 2016-07-13 MED ORDER — TIZANIDINE HCL 2 MG PO TABS: 2.0000 mg | ORAL_TABLET | Freq: Two times a day (BID) | ORAL | 0 refills | Status: DC | PRN

## 2016-07-13 MED ORDER — KETOROLAC TROMETHAMINE 30 MG/ML IJ SOLN: 30.0000 mg | Freq: Once | INTRAMUSCULAR | Status: DC

## 2016-07-13 MED ORDER — PROPRANOLOL HCL ER 80 MG PO CP24: 80.0000 mg | ORAL_CAPSULE | Freq: Every day | ORAL | 11 refills | Status: DC

## 2016-07-13 MED ORDER — KETOROLAC TROMETHAMINE 60 MG/2ML IM SOLN
60.0000 mg | Freq: Once | INTRAMUSCULAR | Status: AC
  Administered 2016-07-13: 60 mg via INTRAMUSCULAR

## 2016-07-13 NOTE — Assessment & Plan Note (Signed)
Diastolics are elevated. Pt has only been taking losartan HCTZ at home. Stopped amlodipine. Will add inderal for BP control and help with HA.  Follow-up 1 week.

## 2016-07-13 NOTE — Assessment & Plan Note (Signed)
Headaches appear to be tension type and stress mediated. Toradol today in office. Trial of inderal and tizanidine to help with pain. Avoid overuse of NSAIDs at home. Headache diary encouraged. Continue ASA given pt history of stroke. Considering stroke history- alarm symptoms reviewed- none present today and close follow-up. Consider neurology if not improving. Consider CT scan if not improving.

## 2016-07-13 NOTE — Patient Instructions (Addendum)
Headache: We will try a toradol injectio today to help with headache.  Try tylenol only for headache at home or Excedrin tension headache at home.  We will also try a blood pressure medication call Propranolol to take once daily to help with headaches. I have also given you a medication call Tizanidine to take 1-2 tablets as needed daily to help with headache.  Please keep a headache diary- write down when it started, what triggered it, what foods you ate, lighting, etc.   GO to the ER if you develop: Numbness with your headache, facial droop, slurred speech, HA wakes you up from sleep, visual changes, chest pain, shortness of breath, or any concerning symptoms.  Please reschedule your cardiology appt: [phone]  We will follow-up in 1 week.

## 2016-07-13 NOTE — Progress Notes (Signed)
Subjective:    Patient ID: Brandon Mullins, male    DOB: 1966/11/24, 50 y.o.   MRN: [id number]  HPI: Brandon Mullins is a 50 y.o. male presenting on 07/13/2016 for Headache (exausted waking up at night feet burning at night )   HPI  Pt presents for follow-up of headaches. Headaches are occurring daily. Usually occur in a band around the top of the head. Migrate to the back of the head on the L side at the occiput. Throbbing pain. Occasional on and off dizziness. Does not track with headaches. Hand gets cold at the tips when he is tired. No weakness. No facial droop. Gets blurred vision before and during a headache. Pt does have a history of stroke in the past. Headaches are occurring right before he gets into work around 9 am. Continue throughout the day. Pain of headache escalates as he leaves work. When he works from home he does not have as many headaches. When headache is present- he is sensitive to bright light but can sit in a brightly lit office. No nausea with the headache- but doesn't feel like eating. Headache has not woken him up from sleep. He does report is he is not sleeping well.  Home treatment: Taking 2 aleve twice per day- most days of the week. Does help with pain for a little while.   Work stressors- have been pushing him to work and do more at his job. Working 60+ hours every week. Goes to work when he is not feeling well. Is feeling exhausted- pushing himself.  Missed his cardiology appt for chest pain.  He has not had any in past 3 weeks but does still have occasional chest pain. He has yet to reschedule. Did not go to the ER.   Past Medical History:  Diagnosis Date  . Erectile dysfunction   . Frequent headaches   . History of chicken pox   . HIV infection (HCC)   . Hyperlipidemia   . Hypertension   . Seasonal allergies   . Stroke (HCC)    x2  . Ulcerative colitis ([facility])     Current Outpatient Prescriptions on File Prior to Visit  Medication Sig  . aspirin EC 325 MG  tablet Take 325 mg by mouth daily.  Marland Kitchen. atorvastatin (LIPITOR) 20 MG tablet Take 20 mg by mouth daily.  . citalopram (CELEXA) 20 MG tablet Take 1 tablet (20 mg total) by mouth daily.  Marland Kitchen. EPZICOM 600-300 MG tablet Take 1 tablet by mouth daily.  . fluticasone (FLONASE) 50 MCG/ACT nasal spray Place 2 sprays into the nose as needed.  . hydrocortisone (ANUSOL-HC) 25 MG suppository Place 1 suppository rectally 2 (two) times daily as needed.  . hydrOXYzine (ATARAX/VISTARIL) 25 MG tablet Take 25 mg by mouth daily.  Marland Kitchen. losartan-hydrochlorothiazide (HYZAAR) 50-12.5 MG tablet Take 1 tablet by mouth daily.  Marland Kitchen. oxymetazoline (AFRIN NASAL SPRAY) 0.05 % nasal spray Place 2 sprays into both nostrils 2 (two) times daily. For 3 days only.  . SUSTIVA 600 MG tablet Take 1 tablet by mouth daily.  Marland Kitchen. triamcinolone cream (KENALOG) 0.1 % Apply 1 application topically 2 (two) times daily.   No current facility-administered medications on file prior to visit.     Review of Systems  Constitutional: Negative for chills and fever.  Respiratory: Negative for chest tightness, shortness of breath and wheezing.   Cardiovascular: Positive for chest pain. Negative for palpitations and leg swelling.  Gastrointestinal: Negative for abdominal pain, nausea and vomiting.  Endocrine: Negative.   Genitourinary: Negative for discharge, dysuria, penile pain, testicular pain and urgency.  Musculoskeletal: Negative for arthralgias, back pain and joint swelling.  Skin: Negative.   Neurological: Positive for dizziness, numbness (occasional. Finger tips. ) and headaches. Negative for seizures, syncope, facial asymmetry, speech difficulty, weakness and light-headedness.  Psychiatric/Behavioral: Negative for dysphoric mood and sleep disturbance.   Per HPI unless specifically indicated above     Objective:    BP (!) 134/99 (BP Location: Right Arm, Patient Position: Sitting, Cuff Size: Normal)   Pulse 89   Temp 98.6 F (37 C) (Oral)    Resp 16   Ht 5\' 10"  (1.778 m)   Wt 144 lb 9.6 oz (65.6 kg)   BMI 20.75 kg/m   Wt Readings from Last 3 Encounters:  07/13/16 144 lb 9.6 oz (65.6 kg)  04/22/16 147 lb (66.7 kg)  03/26/16 144 lb (65.3 kg)    Physical Exam  Constitutional: He is oriented to person, place, and time. He appears well-developed and well-nourished. No distress.  HENT:  Head: Normocephalic and atraumatic.  Eyes: Conjunctivae, EOM and lids are normal. Pupils are equal, round, and reactive to light. Right eye exhibits normal extraocular motion and no nystagmus. Left eye exhibits normal extraocular motion and no nystagmus. Pupils are equal.  Neck: Neck supple. No thyromegaly present.  Cardiovascular: Normal rate, regular rhythm and normal heart sounds.  Exam reveals no gallop and no friction rub.   No murmur heard. Pulmonary/Chest: Effort normal and breath sounds normal. He has no wheezes.  Abdominal: Soft. Bowel sounds are normal. He exhibits no distension. There is no tenderness. There is no rebound.  Musculoskeletal: Normal range of motion. He exhibits no edema or tenderness.  Neurological: He is alert and oriented to person, place, and time. He has normal strength and normal reflexes. No cranial nerve deficit or sensory deficit. He displays a negative Romberg sign. Coordination and gait normal.  Reflex Scores:      Patellar reflexes are 2+ on the right side and 2+ on the left side. Heel to shin intact. Finger to nose intact.   Skin: Skin is warm and dry. No rash noted. No erythema.  Psychiatric: He has a normal mood and affect. His behavior is normal. Thought content normal.   Results for orders placed or performed in visit on 03/26/16  Comprehensive metabolic panel  Result Value Ref Range   Glucose 78 65 - 99 mg/dL   BUN 12 6 - 24 mg/dL   Creatinine, Ser 1.61 0.76 - 1.27 mg/dL   GFR calc non Af Amer 71 >59 mL/min/1.73   GFR calc Af Amer 82 >59 mL/min/1.73   BUN/Creatinine Ratio 10 9 - 20   Sodium 145 (H)  134 - 144 mmol/L   Potassium 4.3 3.5 - 5.2 mmol/L   Chloride 101 96 - 106 mmol/L   CO2 26 18 - 29 mmol/L   Calcium 9.8 8.7 - 10.2 mg/dL   Total Protein 6.9 6.0 - 8.5 g/dL   Albumin 4.6 3.5 - 5.5 g/dL   Globulin, Total 2.3 1.5 - 4.5 g/dL   Albumin/Globulin Ratio 2.0 1.2 - 2.2   Bilirubin Total 0.2 0.0 - 1.2 mg/dL   Alkaline Phosphatase 78 39 - 117 IU/L   AST 21 0 - 40 IU/L   ALT 21 0 - 44 IU/L  Lipid Profile  Result Value Ref Range   Cholesterol, Total 171 100 - 199 mg/dL   Triglycerides 096 (H) 0 - 149 mg/dL  HDL 61 >39 mg/dL   VLDL Cholesterol Cal 53 (H) 5 - 40 mg/dL   LDL Calculated 57 0 - 99 mg/dL   Chol/HDL Ratio 2.8 0.0 - 5.0 ratio units  VITAMIN D 25 Hydroxy (Vit-D Deficiency, Fractures)  Result Value Ref Range   Vit D, 25-Hydroxy 36.3 30.0 - 100.0 ng/mL  TSH  Result Value Ref Range   TSH 1.180 0.450 - 4.500 uIU/mL  B12 and Folate Panel  Result Value Ref Range   Vitamin B-12 276 211 - 946 pg/mL   Folate 4.0 >3.0 ng/mL      Assessment & Plan:   Problem List Items Addressed This Visit      Cardiovascular and Mediastinum   BP (high blood pressure)    Diastolics are elevated. Pt has only been taking losartan HCTZ at home. Stopped amlodipine. Will add inderal for BP control and help with HA.  Follow-up 1 week.       Relevant Medications   propranolol ER (INDERAL LA) 80 MG 24 hr capsule   Other Relevant Orders   BASIC METABOLIC PANEL WITH GFR     Nervous and Auditory   Tension headache, chronic - Primary    Headaches appear to be tension type and stress mediated. Toradol today in office. Trial of inderal and tizanidine to help with pain. Avoid overuse of NSAIDs at home. Headache diary encouraged. Continue ASA given pt history of stroke. Considering stroke history- alarm symptoms reviewed- none present today and close follow-up. Consider neurology if not improving. Consider CT scan if not improving.       Relevant Medications   propranolol ER (INDERAL LA) 80 MG  24 hr capsule   tiZANidine (ZANAFLEX) 2 MG tablet   ketorolac (TORADOL) injection 60 mg (Completed)   Other Relevant Orders   CBC with Differential     Other   History of ischemic stroke without residual deficits    Continue ASA and statin for secondary prevention. Stroke symptoms reviewed with patient and he verbalized understanding of when to seek care.       Chest tightness or pressure    None present today but has occasionally still occurred. Given pt h/o stroke- have asked him to reschedule his cardiology appt for further evaluation. To ER with active chest pains.        Other Visit Diagnoses    Paresthesia of both hands       Does not occur with headache. may be stress or neck related. Will keep close follow-up. IF numbness worsens or continues, or appears with all headaches pt to ER      Meds ordered this encounter  Medications  . DISCONTD: ketorolac (TORADOL) 30 MG/ML injection 30 mg  . propranolol ER (INDERAL LA) 80 MG 24 hr capsule    Sig: Take 1 capsule (80 mg total) by mouth daily.    Dispense:  30 capsule    Refill:  11    Order Specific Question:   Supervising Provider    Answer:   Janeann ForehandHAWKINS JR, JAMES H [phone][[id number]]  . tiZANidine (ZANAFLEX) 2 MG tablet    Sig: Take 1 tablet (2 mg total) by mouth 2 (two) times daily as needed for muscle spasms.    Dispense:  60 tablet    Refill:  0    Order Specific Question:   Supervising Provider    Answer:   Janeann ForehandHAWKINS JR, JAMES H [phone][[id number]]  . ketorolac (TORADOL) injection 60 mg      Follow up plan: Return  in about 1 week (around 07/20/2016), or if symptoms worsen or fail to improve.

## 2016-07-13 NOTE — Assessment & Plan Note (Signed)
Continue ASA and statin for secondary prevention. Stroke symptoms reviewed with patient and he verbalized understanding of when to seek care.

## 2016-07-13 NOTE — Assessment & Plan Note (Signed)
None present today but has occasionally still occurred. Given pt h/o stroke- have asked him to reschedule his cardiology appt for further evaluation. To ER with active chest pains.

## 2016-07-22 ENCOUNTER — Encounter: Payer: Self-pay | Admitting: Family Medicine

## 2016-07-22 ENCOUNTER — Ambulatory Visit (INDEPENDENT_AMBULATORY_CARE_PROVIDER_SITE_OTHER): Payer: BLUE CROSS/BLUE SHIELD | Admitting: Family Medicine

## 2016-07-22 VITALS — BP 120/92 | HR 71 | Temp 98.6°F | Resp 16 | Ht 70.0 in | Wt 143.0 lb

## 2016-07-22 DIAGNOSIS — R531 Weakness: Secondary | ICD-10-CM | POA: Diagnosis not present

## 2016-07-22 DIAGNOSIS — Z8673 Personal history of transient ischemic attack (TIA), and cerebral infarction without residual deficits: Secondary | ICD-10-CM

## 2016-07-22 DIAGNOSIS — R51 Headache: Secondary | ICD-10-CM

## 2016-07-22 DIAGNOSIS — R0789 Other chest pain: Secondary | ICD-10-CM

## 2016-07-22 DIAGNOSIS — R519 Headache, unspecified: Secondary | ICD-10-CM

## 2016-07-22 MED ORDER — PROPRANOLOL HCL ER 120 MG PO CP24: 120.0000 mg | ORAL_CAPSULE | Freq: Every day | ORAL | 11 refills | Status: DC

## 2016-07-22 NOTE — Patient Instructions (Addendum)
I have made you an appt to Cardiology on- September 12 at 1pm with Dr. Alvino ChapelIngal. You are on the wait list to for cancellations. They will contact you if they have an earlier appt.   IF you continue to have chest pain- you need to be seen in the ER to rule out a heart attack.  We will schedule a CT scan of your head for the headaches. I am also increasing your Inderal 120mg  once daily to see if that helps with the headaches. Given your history of stroke and where the pain is occurring I would also like you to see neurology.   Please seek immediate medical attention at ER or Urgent Care if you develop: Chest pain, pressure or tightness. Shortness of breath accompanied by nausea or diaphoresis Visual changes Numbness or tingling on one side of the body Facial droop Altered mental status Or any concerning symptoms.

## 2016-07-22 NOTE — Progress Notes (Signed)
Subjective:    Patient ID: Brandon Mullins, male    DOB: 09-30-1966, 50 y.o.   MRN: [id number]  HPI: Brandon Mullins is a 50 y.o. male presenting on 07/22/2016 for Hand Pain (follow up not improved yesterday was having chest pain not sharp but was tightness as per pt not dizzy but was exchusted)   HPI Pt presents for follow-up of headaches. Headaches have lessened since starting the Inderal. Feeling tired/weak in the evenings. No associated numbness or tingling. Always on the L side of the head. Start at the occipital lobe and go up. Headaches occur daily. Headaches are not as intense as they used to be Taking exedrin tension headache seem to help. Zanaflex does not seem to help.  He also continues to have chest pain. Last episode was yesterday. He did not reschedule his cardiology appt. He did not seek care at the time of the chest pain. No active chest pain today. Chest pain is occurring at rest.  Described as tightness or pressure. Pain typically lasts 3 minutes. Pain is a 3/10.   Past Medical History:  Diagnosis Date  . Erectile dysfunction   . Frequent headaches   . History of chicken pox   . HIV infection ([facility])   . Hyperlipidemia   . Hypertension   . Seasonal allergies   . Stroke ([facility])    x2  . Ulcerative colitis ([facility])     Current Outpatient Prescriptions on File Prior to Visit  Medication Sig  . aspirin EC 325 MG tablet Take 325 mg by mouth daily.  Marland Kitchen atorvastatin (LIPITOR) 20 MG tablet Take 20 mg by mouth daily.  . citalopram (CELEXA) 20 MG tablet Take 1 tablet (20 mg total) by mouth daily.  Marland Kitchen EPZICOM 600-300 MG tablet Take 1 tablet by mouth daily.  . fluticasone (FLONASE) 50 MCG/ACT nasal spray Place 2 sprays into the nose as needed.  . hydrocortisone (ANUSOL-HC) 25 MG suppository Place 1 suppository rectally 2 (two) times daily as needed.  . hydrOXYzine (ATARAX/VISTARIL) 25 MG tablet Take 25 mg by mouth daily.  Marland Kitchen losartan-hydrochlorothiazide (HYZAAR) 50-12.5 MG tablet Take 1  tablet by mouth daily.  Marland Kitchen oxymetazoline (AFRIN NASAL SPRAY) 0.05 % nasal spray Place 2 sprays into both nostrils 2 (two) times daily. For 3 days only.  . SUSTIVA 600 MG tablet Take 1 tablet by mouth daily.  Marland Kitchen tiZANidine (ZANAFLEX) 2 MG tablet Take 1 tablet (2 mg total) by mouth 2 (two) times daily as needed for muscle spasms.  Marland Kitchen triamcinolone cream (KENALOG) 0.1 % Apply 1 application topically 2 (two) times daily.   No current facility-administered medications on file prior to visit.     Review of Systems  Constitutional: Positive for fatigue. Negative for chills and fever.  Respiratory: Negative for chest tightness, shortness of breath and wheezing.   Cardiovascular: Positive for chest pain. Negative for palpitations and leg swelling.  Gastrointestinal: Negative for abdominal pain, nausea and vomiting.  Endocrine: Negative.   Genitourinary: Negative for discharge, dysuria, penile pain, testicular pain and urgency.  Musculoskeletal: Negative for arthralgias, back pain and joint swelling.  Skin: Negative.   Neurological: Positive for headaches. Negative for dizziness, weakness and numbness.  Psychiatric/Behavioral: Negative for dysphoric mood and sleep disturbance.   Per HPI unless specifically indicated above     Objective:    BP (!) 120/92 (BP Location: Right Arm, Patient Position: Sitting, Cuff Size: Normal)   Pulse 71   Temp 98.6 F (37 C) (Oral)  Resp 16   Ht '5\' 10"'  (1.778 m)   Wt 143 lb (64.9 kg)   SpO2 100%   BMI 20.52 kg/m   Wt Readings from Last 3 Encounters:  07/22/16 143 lb (64.9 kg)  07/13/16 144 lb 9.6 oz (65.6 kg)  04/22/16 147 lb (66.7 kg)    Physical Exam  Constitutional: He is oriented to person, place, and time. He appears well-developed and well-nourished. No distress.  HENT:  Head: Normocephalic and atraumatic.  Neck: Neck supple. No thyromegaly present.  Cardiovascular: Normal rate, regular rhythm and normal heart sounds.  Exam reveals no gallop and  no friction rub.   No murmur heard. Pulmonary/Chest: Effort normal and breath sounds normal. He has no wheezes.  Abdominal: Soft. Bowel sounds are normal. He exhibits no distension. There is no tenderness. There is no rebound.  Musculoskeletal: Normal range of motion. He exhibits no edema or tenderness.  Neurological: He is alert and oriented to person, place, and time. He has normal reflexes.  Skin: Skin is warm and dry. No rash noted. No erythema.  Psychiatric: He has a normal mood and affect. His behavior is normal. Thought content normal.   Results for orders placed or performed in visit on 07/13/16  CBC with Differential  Result Value Ref Range   WBC 9.6 3.8 - 10.8 K/uL   RBC 4.69 4.20 - 5.80 MIL/uL   Hemoglobin 14.7 13.2 - 17.1 g/dL   HCT 42.9 38.5 - 50.0 %   MCV 91.5 80.0 - 100.0 fL   MCH 31.3 27.0 - 33.0 pg   MCHC 34.3 32.0 - 36.0 g/dL   RDW 14.0 11.0 - 15.0 %   Platelets 338 140 - 400 K/uL   MPV 9.8 7.5 - 12.5 fL   Neutro Abs 5,952 1,500 - 7,800 cells/uL   Lymphs Abs 2,400 850 - 3,900 cells/uL   Monocytes Absolute 960 (H) 200 - 950 cells/uL   Eosinophils Absolute 192 15 - 500 cells/uL   Basophils Absolute 96 0 - 200 cells/uL   Neutrophils Relative % 62 %   Lymphocytes Relative 25 %   Monocytes Relative 10 %   Eosinophils Relative 2 %   Basophils Relative 1 %   Smear Review Criteria for review not met   BASIC METABOLIC PANEL WITH GFR  Result Value Ref Range   Sodium 138 135 - 146 mmol/L   Potassium 4.7 3.5 - 5.3 mmol/L   Chloride 101 98 - 110 mmol/L   CO2 26 20 - 31 mmol/L   Glucose, Bld 101 (H) 65 - 99 mg/dL   BUN 15 7 - 25 mg/dL   Creat 0.99 0.60 - 1.35 mg/dL   Calcium 9.7 8.6 - 10.3 mg/dL   GFR, Est African American >89 >=60 mL/min   GFR, Est Non African American >89 >=60 mL/min      Assessment & Plan:   Problem List Items Addressed This Visit      Other   History of ischemic stroke without residual deficits    Neurology referral 2/2 previous history of  stroke.       Relevant Orders   CT Head Wo Contrast   Ambulatory referral to Neurology   Chronic daily headache - Primary    Will get CT scan due to location of symptoms and previous stroke. Refer to neurology for evaluation consider weakness and history of stroke. Increase inderal to 156m once daily. Headache alarm symptoms reviewed.  Return in 4 weeks.      Relevant  Medications   propranolol ER (INDERAL LA) 120 MG 24 hr capsule   Other Relevant Orders   CT Head Wo Contrast   Ambulatory referral to Neurology    Other Visit Diagnoses    Chest pressure       Made pt cardiology appt on 9/12. He verbalized understanding to go to ER if chest pain occurs. Continue statin and ASA.    Relevant Orders   EKG 12-Lead   Weakness       2/2 headaches vs. side effect of Inderal. Will have neurology consult and CT scan considering pt history of stroke.    Relevant Orders   Ambulatory referral to Neurology      Meds ordered this encounter  Medications  . propranolol ER (INDERAL LA) 120 MG 24 hr capsule    Sig: Take 1 capsule (120 mg total) by mouth daily.    Dispense:  30 capsule    Refill:  11    Order Specific Question:   Supervising Provider    Answer:   Arlis Porta [phone]      Follow up plan: Return in about 4 weeks (around 08/19/2016), or if symptoms worsen or fail to improve.

## 2016-07-22 NOTE — Assessment & Plan Note (Signed)
Neurology referral 2/2 previous history of stroke.

## 2016-07-22 NOTE — Assessment & Plan Note (Signed)
Will get CT scan due to location of symptoms and previous stroke. Refer to neurology for evaluation consider weakness and history of stroke. Increase inderal to 120mg  once daily. Headache alarm symptoms reviewed.  Return in 4 weeks.

## 2016-08-03 ENCOUNTER — Ambulatory Visit
Admission: RE | Admit: 2016-08-03 | Discharge: 2016-08-03 | Disposition: A | Discharge status: Home / Self Care | Payer: BLUE CROSS/BLUE SHIELD | Source: Ambulatory Visit | Attending: Family Medicine | Admitting: Family Medicine

## 2016-08-03 DIAGNOSIS — R51 Headache: Secondary | ICD-10-CM | POA: Diagnosis present

## 2016-08-03 DIAGNOSIS — R519 Headache, unspecified: Secondary | ICD-10-CM

## 2016-08-03 DIAGNOSIS — Z8673 Personal history of transient ischemic attack (TIA), and cerebral infarction without residual deficits: Secondary | ICD-10-CM | POA: Insufficient documentation

## 2016-08-03 NOTE — Progress Notes (Signed)
Cardiology Office Note   Date:  08/10/2016   ID:  Brandon Raiderllen E Diltz, DOB [birth year], MRN [id number]  Referring Doctor:  Filbert BertholdAmy L Krebs, NP   Cardiologist:   Almond LintAileen Justyna Timoney, MD   Reason for consultation:  Chief Complaint  Patient presents with  . Other    C/o chest pain, sob and fatigue. Meds reviewed verbally with pt.      History of Present Illness: Brandon Mullins is a 50 y.o. male who presents for chest pain, sob, fatigue.  Symptoms have been going on since March of this year he describes the chest pain has a mild discomfort or tightness in the chest. Lasts a few minutes at a time, intensity is mild. Not always associated with exertion. Sometimes associated with a spicy meal. Nonradiating. This is associated with shortness of breath and fatigue.  The activities that bring on fatigue are after a days work, and showering and stepping out of the shower.  Patient denies palpitations, loss of consciousness, PND, orthopnea, edema.  ROS:  Please see the history of present illness. Aside from mentioned under HPI, all other systems are reviewed and negative.     Past Medical History:  Diagnosis Date  . Erectile dysfunction   . Frequent headaches   . History of chicken pox   . HIV infection (HCC)   . Hyperlipidemia   . Hypertension   . Murmur, heart    as child  . Seasonal allergies   . Stroke (HCC)    x2  . Ulcerative colitis [facility](HCC)     Past Surgical History:  Procedure Laterality Date  . HERNIA REPAIR       reports that he has never smoked. He has never used smokeless tobacco. He reports that he drinks alcohol. He reports that he does not use drugs.   family history includes AAA (abdominal aortic aneurysm) in his mother.   Outpatient Medications Prior to Visit  Medication Sig Dispense Refill  . aspirin EC 325 MG tablet Take 325 mg by mouth daily.    Marland Kitchen. atorvastatin (LIPITOR) 20 MG tablet Take 20 mg by mouth daily.  3  . citalopram (CELEXA) 20 MG tablet Take 1 tablet (20  mg total) by mouth daily. 30 tablet 11  . EPZICOM 600-300 MG tablet Take 1 tablet by mouth daily.  3  . fluticasone (FLONASE) 50 MCG/ACT nasal spray Place 2 sprays into the nose as needed.    . propranolol ER (INDERAL LA) 120 MG 24 hr capsule Take 1 capsule (120 mg total) by mouth daily. 30 capsule 11  . SUSTIVA 600 MG tablet Take 1 tablet by mouth daily.  3  . tiZANidine (ZANAFLEX) 2 MG tablet Take 1 tablet (2 mg total) by mouth 2 (two) times daily as needed for muscle spasms. 60 tablet 0  . hydrocortisone (ANUSOL-HC) 25 MG suppository Place 1 suppository rectally 2 (two) times daily as needed.    . hydrOXYzine (ATARAX/VISTARIL) 25 MG tablet Take 25 mg by mouth daily.    Marland Kitchen. losartan-hydrochlorothiazide (HYZAAR) 50-12.5 MG tablet Take 1 tablet by mouth daily. (Patient not taking: Reported on 08/10/2016) 30 tablet 11  . oxymetazoline (AFRIN NASAL SPRAY) 0.05 % nasal spray Place 2 sprays into both nostrils 2 (two) times daily. For 3 days only. (Patient not taking: Reported on 08/10/2016) 30 mL 0  . triamcinolone cream (KENALOG) 0.1 % Apply 1 application topically 2 (two) times daily.     No facility-administered medications prior to visit.  Allergies: Review of patient's allergies indicates no known allergies.    PHYSICAL EXAM: VS:  BP 116/78 (BP Location: Right Arm, Patient Position: Sitting, Cuff Size: Normal)   Pulse 68   Ht 5\' 10"  (1.778 m)   Wt 143 lb 8 oz (65.1 kg)   BMI 20.59 kg/m  , Body mass index is 20.59 kg/m. Wt Readings from Last 3 Encounters:  08/10/16 143 lb 8 oz (65.1 kg)  07/22/16 143 lb (64.9 kg)  07/13/16 144 lb 9.6 oz (65.6 kg)    GENERAL:  well developed, well nourished, not in acute distress HEENT: normocephalic, pink conjunctivae, anicteric sclerae, no xanthelasma, normal dentition, oropharynx clear NECK:  no neck vein engorgement, JVP normal, no hepatojugular reflux, carotid upstroke brisk and symmetric, no bruit, no thyromegaly, no lymphadenopathy LUNGS:   good respiratory effort, clear to auscultation bilaterally CV:  PMI not displaced, no thrills, no lifts, S1 and S2 within normal limits, no palpable S3 or S4, no murmurs, no rubs, no gallops ABD:  Soft, nontender, nondistended, normoactive bowel sounds, no abdominal aortic bruit, no hepatomegaly, no splenomegaly MS: nontender back, no kyphosis, no scoliosis, no joint deformities EXT:  2+ DP/PT pulses, no edema, no varicosities, no cyanosis, no clubbing SKIN: warm, nondiaphoretic, normal turgor, no ulcers NEUROPSYCH: alert, oriented to person, place, and time, sensory/motor grossly intact, normal mood, appropriate affect  Recent Labs: 04/07/2016: ALT 21; TSH 1.180 07/13/2016: BUN 15; Creat 0.99; Hemoglobin 14.7; Platelets 338; Potassium 4.7; Sodium 138   Lipid Panel    Component Value Date/Time   CHOL 171 04/07/2016 1422   TRIG 263 (H) 04/07/2016 1422   HDL 61 04/07/2016 1422   CHOLHDL 2.8 04/07/2016 1422   LDLCALC 57 04/07/2016 1422     Other studies Reviewed:  EKG:  The ekg from09/10/2069 was personally reviewed by me and it revealed sinus rhythm, 68 BPM.  Additional studies/ records that were reviewed personally reviewed by me today include: None available   ASSESSMENT AND PLAN:  Chest pain Shortness of breath Fatigue Risk factors for CAD include gender, hypertension. Patient also previously diagnosed with TIA. He is set up to see a neurologist for this. Recommend stress echocardiogram. Recommend echocardiogram.  HTN BP is well controlled. Continue monitoring BP. Continue current medical therapy and lifestyle changes.   Current medicines are reviewed at length with the patient today.  The patient does not have concerns regarding medicines.  Labs/ tests ordered today include:  Orders Placed This Encounter  Procedures  . EKG 12-Lead    I had a lengthy and detailed discussion with the patient regarding diagnoses, prognosis, diagnostic options, treatment options.  I  counseled the patient on importance of lifestyle modification including heart healthy diet, regular physical activity Once cardiac workup is completed.   Disposition:   FU with undersigned after tests prn   Signed, Almond Lint, MD  08/10/2016 1:25 PM    [facility]  This note was generated in part with voice recognition software and I apologize for any typographical errors that were not detected and corrected.

## 2016-08-10 ENCOUNTER — Encounter: Payer: Self-pay | Admitting: Cardiology

## 2016-08-10 ENCOUNTER — Ambulatory Visit (INDEPENDENT_AMBULATORY_CARE_PROVIDER_SITE_OTHER): Payer: BLUE CROSS/BLUE SHIELD | Admitting: Cardiology

## 2016-08-10 VITALS — BP 116/78 | HR 68 | Ht 70.0 in | Wt 143.5 lb

## 2016-08-10 DIAGNOSIS — R5383 Other fatigue: Secondary | ICD-10-CM

## 2016-08-10 DIAGNOSIS — R0602 Shortness of breath: Secondary | ICD-10-CM

## 2016-08-10 DIAGNOSIS — R0789 Other chest pain: Secondary | ICD-10-CM | POA: Diagnosis not present

## 2016-08-10 NOTE — Patient Instructions (Addendum)
Testing/Procedures: Your physician has requested that you have an echocardiogram. Echocardiography is a painless test that uses sound waves to create images of your heart. It provides your doctor with information about the size and shape of your heart and how well your heart's chambers and valves are working. This procedure takes approximately one hour. There are no restrictions for this procedure.  Your physician has requested that you have a stress echocardiogram. For further information please visit [email or web address] Please follow instruction sheet as given.    Follow-Up: Your physician recommends that you schedule a follow-up appointment as needed. We will call you with results and schedule follow up at that time if needed.  It was a pleasure seeing you today here in the office. Please do not hesitate to give [place] a call back if you have any further questions. [phone]  Crookston Cellar RN, BSN    Echocardiogram An echocardiogram, or echocardiography, uses sound waves (ultrasound) to produce an image of your heart. The echocardiogram is simple, painless, obtained within a short period of time, and offers valuable information to your health care provider. The images from an echocardiogram can provide information such as:  Evidence of coronary artery disease (CAD).  Heart size.  Heart muscle function.  Heart valve function.  Aneurysm detection.  Evidence of a past heart attack.  Fluid buildup around the heart.  Heart muscle thickening.  Assess heart valve function. LET [facility] CARE PROVIDER KNOW ABOUT:  Any allergies you have.  All medicines you are taking, including vitamins, herbs, eye drops, creams, and over-the-counter medicines.  Previous problems you or members of your family have had with the use of anesthetics.  Any blood disorders you have.  Previous surgeries you have had.  Medical conditions you have.  Possibility of pregnancy, if this applies. BEFORE  THE PROCEDURE  No special preparation is needed. Eat and drink normally.  PROCEDURE   In order to produce an image of your heart, gel will be applied to your chest and a wand-like tool (transducer) will be moved over your chest. The gel will help transmit the sound waves from the transducer. The sound waves will harmlessly bounce off your heart to allow the heart images to be captured in real-time motion. These images will then be recorded.  You may need an IV to receive a medicine that improves the quality of the pictures. AFTER THE PROCEDURE You may return to your normal schedule including diet, activities, and medicines, unless your health care provider tells you otherwise.   This information is not intended to replace advice given to you by your health care provider. Make sure you discuss any questions you have with your health care provider.   Document Released: 11/12/2000 Document Revised: 12/06/2014 Document Reviewed: 07/23/2013 Elsevier Interactive Patient Education 2016 [employer].   Exercise Stress Echocardiogram An exercise stress echocardiogram is a heart (cardiac) test used to check the function of your heart. This test may also be called an exercise stress echocardiography or stress echo. This stress test will check how well your heart muscle and valves are working and determine if your heart muscle is getting enough blood. You will exercise on a treadmill to naturally increase or stress the functioning of your heart.  An echocardiogram uses sound waves (ultrasound) to produce an image of your heart. If your heart does not work normally, it may indicate coronary artery disease with poor coronary blood supply. The coronary arteries are the arteries that bring blood and oxygen to your  heart. [facility] KNOW ABOUT:  Any allergies you have.  All medicines you are taking, including vitamins, herbs, eye drops, creams, and over-the-counter medicines.  Previous  problems you or members of your family have had with the use of anesthetics.  Any blood disorders you have.  Previous surgeries you have had.  Medical conditions you have.  Possibility of pregnancy, if this applies. RISKS AND COMPLICATIONS Generally, this is a safe procedure. However, as with any procedure, complications can occur. Possible complications can include:  You develop pain or pressure in the following areas:  Chest.  Jaw or neck.  Between your shoulder blades.  Radiating down your left arm.  Dizziness or lightheadedness.  Shortness of breath.  Increased or irregular heartbeat.  Nausea or vomiting.  Heart attack (rare). BEFORE THE PROCEDURE  Avoid all forms of caffeine for 24 hours before your test or as directed by your health care provider. This includes coffee, tea (even decaffeinated tea), caffeinated sodas, chocolate, cocoa, and certain pain medicines.  Follow your health care provider's instructions regarding eating and drinking before the test.  Take your medicines as directed at regular times with water unless instructed otherwise. Exceptions may include:  If you have diabetes, ask how you are to take your insulin or pills. It is common to adjust insulin dosing the morning of the test.  If you are taking beta-blocker medicines, it is important to talk to your health care provider about these medicines well before the date of your test. Taking beta-blocker medicines may interfere with the test. In some cases, these medicines need to be changed or stopped 24 hours or more before the test.  If you wear a nitroglycerin patch, it may need to be removed prior to the test. Ask your health care provider if the patch should be removed before the test.  If you use an inhaler for any breathing condition, bring it with you to the test.  If you are an outpatient, bring a snack so you can eat right after the stress phase of the test.  Do not smoke for 4 hours  prior to the test or as directed by your health care provider.  Wear loose-fitting clothes and comfortable shoes for the test. This test involves walking on a treadmill. PROCEDURE   Multiple electrodes will be put on your chest. If needed, small areas of your chest may be shaved to get better contact with the electrodes. Once the electrodes are attached to your body, multiple wires will be attached to the electrodes, and your heart rate will be monitored.  You will have an echocardiogram done at rest.  To produce this image of your heart, gel is applied to your chest, and a wand-like tool (transducer) is moved over the chest. The transducer sends the sound waves through the chest to create the moving images of your heart.  You may need an IV to receive a medication that improves the quality of the pictures.  You will then walk on a treadmill. The treadmill will be started at a slow pace. The treadmill speed and incline will gradually be increased to raise your heart rate.  At the peak of exercise, the treadmill will be stopped. You will lie down immediately on a bed so that a second echocardiogram can be done to visualize your heart's motion with exercise.  The test usually takes 30-60 minutes to complete. AFTER THE PROCEDURE  Your heart rate and blood pressure will be monitored after the  test.  You may return to your normal schedule, including diet, activities, and medicines, unless your health care provider tells you otherwise.   This information is not intended to replace advice given to you by your health care provider. Make sure you discuss any questions you have with your health care provider.   Document Released: 11/19/2004 Document Revised: 11/20/2013 Document Reviewed: 07/23/2013 Elsevier Interactive Patient Education [employer]

## 2016-08-12 ENCOUNTER — Ambulatory Visit: Payer: Self-pay | Admitting: Neurology

## 2016-08-12 ENCOUNTER — Ambulatory Visit (INDEPENDENT_AMBULATORY_CARE_PROVIDER_SITE_OTHER): Payer: BLUE CROSS/BLUE SHIELD | Admitting: Neurology

## 2016-08-12 ENCOUNTER — Encounter: Payer: Self-pay | Admitting: Neurology

## 2016-08-12 VITALS — BP 122/74 | HR 62 | Resp 16 | Ht 70.0 in | Wt 144.0 lb

## 2016-08-12 DIAGNOSIS — R0683 Snoring: Secondary | ICD-10-CM

## 2016-08-12 DIAGNOSIS — R0681 Apnea, not elsewhere classified: Secondary | ICD-10-CM

## 2016-08-12 DIAGNOSIS — Z8673 Personal history of transient ischemic attack (TIA), and cerebral infarction without residual deficits: Secondary | ICD-10-CM | POA: Diagnosis not present

## 2016-08-12 DIAGNOSIS — G44221 Chronic tension-type headache, intractable: Secondary | ICD-10-CM

## 2016-08-12 DIAGNOSIS — R351 Nocturia: Secondary | ICD-10-CM

## 2016-08-12 DIAGNOSIS — B20 Human immunodeficiency virus [HIV] disease: Secondary | ICD-10-CM | POA: Diagnosis not present

## 2016-08-12 MED ORDER — AMITRIPTYLINE HCL 25 MG PO TABS: ORAL_TABLET | ORAL | 5 refills | Status: DC

## 2016-08-12 MED ORDER — TIZANIDINE HCL 2 MG PO TABS: 2.0000 mg | ORAL_TABLET | Freq: Every evening | ORAL | 2 refills | Status: DC | PRN

## 2016-08-12 NOTE — Patient Instructions (Addendum)
Please remember, common headache triggers are: sleep deprivation, dehydration, overheating, stress, hypoglycemia or skipping meals and blood sugar fluctuations, excessive pain medications or excessive alcohol use or caffeine withdrawal. Some people have food triggers such as aged cheese, orange juice or chocolate, especially dark chocolate, or MSG (monosodium glutamate). Try to avoid these headache triggers as much possible. It may be helpful to keep a headache diary to figure out what makes your headaches worse or brings them on and what alleviates them. Some people report headache onset after exercise but studies have shown that regular exercise may actually prevent headaches from coming. If you have exercise-induced headaches, please make sure that you drink plenty of fluid before and after exercising and that you do not over do it and do not overheat.  We will try elavil for your headache in low dose. You have to take it daily. Elavil (generic name: amitriptyline) 25 mg: Take half a pill daily at bedtime for one week, then one pill daily at bedtime thereafter. Common side effects reported are: mouth dryness, drowsiness, confusion, dizziness.   You can continue to take the Zanaflex as needed, I have given you a refill on this.   We will do a brain scan, called MRI and call you with the test results. We will have to schedule you for this on a separate date. This test requires authorization from your insurance, and we will take care of the insurance process.  Based on your symptoms and your exam I believe you are at risk for obstructive sleep apnea or OSA, and I think we should proceed with a sleep study to determine whether you do or do not have OSA and how severe it is. If you have more than mild OSA, I want you to consider treatment with CPAP. Please remember, the risks and ramifications of moderate to severe obstructive sleep apnea or OSA are: Cardiovascular disease, including congestive heart failure,  stroke, difficult to control hypertension, arrhythmias, and even type 2 diabetes has been linked to untreated OSA. Sleep apnea causes disruption of sleep and sleep deprivation in most cases, which, in turn, can cause recurrent headaches, problems with memory, mood, concentration, focus, and vigilance. Most people with untreated sleep apnea report excessive daytime sleepiness, which can affect their ability to drive. Please do not drive if you feel sleepy.   I will likely see you back after your sleep study to go over the test results and where to go from there. We will call you after your sleep study to advise about the results (most likely, you will hear from Lafonda Mossesiana, my nurse) and to set up an appointment at the time, as necessary.    Our sleep lab administrative assistant, Alvis LemmingsDawn will meet with you or call you to schedule your sleep study. If you don't hear back from her by next week please feel free to call her at [phone]. This is her direct line and please leave a message with your phone number to call back if you get the voicemail box. She will call back as soon as possible.
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
## 2016-08-19 ENCOUNTER — Telehealth: Payer: Self-pay | Admitting: Family Medicine

## 2016-08-19 ENCOUNTER — Ambulatory Visit (INDEPENDENT_AMBULATORY_CARE_PROVIDER_SITE_OTHER): Payer: BLUE CROSS/BLUE SHIELD | Admitting: Family Medicine

## 2016-08-19 VITALS — BP 122/84 | HR 72 | Temp 98.4°F | Resp 16 | Ht 70.0 in | Wt 146.0 lb

## 2016-08-19 DIAGNOSIS — R7309 Other abnormal glucose: Secondary | ICD-10-CM

## 2016-08-19 DIAGNOSIS — I10 Essential (primary) hypertension: Secondary | ICD-10-CM | POA: Diagnosis not present

## 2016-08-19 DIAGNOSIS — Z8673 Personal history of transient ischemic attack (TIA), and cerebral infarction without residual deficits: Secondary | ICD-10-CM

## 2016-08-19 DIAGNOSIS — R519 Headache, unspecified: Secondary | ICD-10-CM

## 2016-08-19 DIAGNOSIS — R51 Headache: Secondary | ICD-10-CM | POA: Diagnosis not present

## 2016-08-19 NOTE — Progress Notes (Signed)
Subjective:    Patient ID: Brandon Mullins, male    DOB: 1966-04-17, 50 y.o.   MRN: [id number]  HPI: Brandon Mullins is a 50 y.o. male presenting on 08/19/2016 for Hypertension   HPI  Pt presents for follow-up of hypertension and headaches. Has seen neurology- they are setting up a sleep study and MRI to evaluate the headaches. His oropharynx anatomy was concerning for OSA. Mild snoring. His brother did witness an apnea. Neck circumference 15.5in.  Headaches are the same. Taking Inderal 187m once daily. Neurology started amitriptyline- 12.mg once daily- he has not started it yet. Is still taking Zanaflex PRN for neck pain.  Stress test in Next Friday as ordered by cardiology. Sleep study is pending- could take 2 weeks to be able to schedule due to insurance. Neurology will see him back after tests are completed.  He is currently out on FMLA- is scheduled to go to back on 08/29/2016.  He is having some R shoulder pain today. Was discussed with neurology- he does have some mild R sided weakness that is residual from his stroke. Gets tired on the R side when doing activities such as shaving. He also notes muscle pain with stress. He did not complete PT s/p stroke in 2015.  He is amenable to see PT at this time to see if the strength can be improved or if OT can help with coping mechanisms.   Past Medical History:  Diagnosis Date  . Anxiety   . Depression   . Erectile dysfunction   . Frequent headaches   . History of chicken pox   . HIV infection ([place]   . Hyperlipidemia   . Hypertension   . Murmur, heart    as child  . Seasonal allergies   . Stroke ([facility]    x2  . Ulcerative colitis ([facility]     Current Outpatient Prescriptions on File Prior to Visit  Medication Sig  . amitriptyline (ELAVIL) 25 MG tablet 1/2 pill each bedtime x 1 week, then 1 pill nightly x 1 week, then 1 pill nightly thereafter.  .Marland Kitchenaspirin EC 325 MG tablet Take 325 mg by mouth daily.  .Marland Kitchenatorvastatin (LIPITOR) 20 MG tablet  Take 20 mg by mouth daily.  . citalopram (CELEXA) 20 MG tablet Take 1 tablet (20 mg total) by mouth daily.  .Marland KitchenEPZICOM 600-300 MG tablet Take 1 tablet by mouth daily.  . fluticasone (FLONASE) 50 MCG/ACT nasal spray Place 2 sprays into the nose as needed.  . propranolol ER (INDERAL LA) 120 MG 24 hr capsule Take 1 capsule (120 mg total) by mouth daily.  . SUSTIVA 600 MG tablet Take 1 tablet by mouth daily.  .Marland KitchentiZANidine (ZANAFLEX) 2 MG tablet Take 1 tablet (2 mg total) by mouth at bedtime as needed for muscle spasms.   No current facility-administered medications on file prior to visit.     Review of Systems  Constitutional: Negative for chills and fever.  Respiratory: Negative for chest tightness, shortness of breath and wheezing.   Cardiovascular: Negative for chest pain, palpitations and leg swelling.  Gastrointestinal: Negative for abdominal pain, nausea and vomiting.  Endocrine: Negative.   Genitourinary: Negative for discharge, dysuria, penile pain, testicular pain and urgency.  Musculoskeletal: Positive for myalgias and neck pain. Negative for arthralgias, back pain and joint swelling.  Skin: Negative.   Neurological: Positive for headaches. Negative for dizziness, weakness and numbness.  Psychiatric/Behavioral: Negative for dysphoric mood and sleep disturbance.   Per HPI  unless specifically indicated above     Objective:    BP 122/84 (BP Location: Left Arm, Cuff Size: Large)   Pulse 72   Temp 98.4 F (36.9 C) (Oral)   Resp 16   Ht _0  (1.778 m)   Wt 146 lb (66.2 kg)   BMI 20.95 kg/m   Wt Readings from Last 3 Encounters:  08/19/16 146 lb (66.2 kg)  08/12/16 144 lb (65.3 kg)  08/10/16 143 lb 8 oz (65.1 kg)    Physical Exam  Constitutional: He is oriented to person, place, and time. He appears well-developed and well-nourished. No distress.  HENT:  Head: Normocephalic and atraumatic.  Neck: Neck supple. No thyromegaly present.  Cardiovascular: Normal rate, regular  rhythm and normal heart sounds.  Exam reveals no gallop and no friction rub.   No murmur heard. Pulmonary/Chest: Effort normal and breath sounds normal. He has no wheezes. He has no rhonchi. He has no rales.  Abdominal: Soft. Bowel sounds are normal. He exhibits no distension. There is no tenderness. There is no rebound.  Musculoskeletal: Normal range of motion. He exhibits no edema or tenderness.  Neurological: He is alert and oriented to person, place, and time. He has normal reflexes. He displays no atrophy and no tremor. No cranial nerve deficit or sensory deficit. He displays a negative Romberg sign. Gait normal.  Skin: Skin is warm and dry. No rash noted. No erythema.  Psychiatric: He has a normal mood and affect. His behavior is normal. Thought content normal.   Results for orders placed or performed in visit on 07/13/16  CBC with Differential  Result Value Ref Range   WBC 9.6 3.8 - 10.8 K/uL   RBC 4.69 4.20 - 5.80 MIL/uL   Hemoglobin 14.7 13.2 - 17.1 g/dL   HCT 42.9 38.5 - 50.0 %   MCV 91.5 80.0 - 100.0 fL   MCH 31.3 27.0 - 33.0 pg   MCHC 34.3 32.0 - 36.0 g/dL   RDW 14.0 11.0 - 15.0 %   Platelets 338 140 - 400 K/uL   MPV 9.8 7.5 - 12.5 fL   Neutro Abs 5,952 1,500 - 7,800 cells/uL   Lymphs Abs 2,400 850 - 3,900 cells/uL   Monocytes Absolute 960 (H) 200 - 950 cells/uL   Eosinophils Absolute 192 15 - 500 cells/uL   Basophils Absolute 96 0 - 200 cells/uL   Neutrophils Relative % 62 %   Lymphocytes Relative 25 %   Monocytes Relative 10 %   Eosinophils Relative 2 %   Basophils Relative 1 %   Smear Review Criteria for review not met   BASIC METABOLIC PANEL WITH GFR  Result Value Ref Range   Sodium 138 135 - 146 mmol/L   Potassium 4.7 3.5 - 5.3 mmol/L   Chloride 101 98 - 110 mmol/L   CO2 26 20 - 31 mmol/L   Glucose, Bld 101 (H) 65 - 99 mg/dL   BUN 15 7 - 25 mg/dL   Creat 0.99 0.60 - 1.35 mg/dL   Calcium 9.7 8.6 - 10.3 mg/dL   GFR, Est African American >89 >=60 mL/min    GFR, Est Non African American >89 >=60 mL/min      Assessment & Plan:   Problem List Items Addressed This Visit      Cardiovascular and Mediastinum   BP (high blood pressure) - Primary    Controlled on recheck. Consider adding ACE to his Inderal if diastolic remain high. Ideally BP will decrease if sleep  apnea is treated and diagnosed.       Relevant Medications   hydrochlorothiazide (HYDRODIURIL) 12.5 MG tablet   Other Relevant Orders   Basic Metabolic Panel (BMET)     Other   H/O arterial ischemic stroke    Some mild R sided weakness. Discussed option of OT/PT with the patient for weakness and feeling fatigued when using that side. He is amenable to an OT/PT consult. Prescription given for [employer] today.       Chronic daily headache    Awaiting MRI and sleep study as ordered by neurology. Pt encourage to take amitriptyline as prescribed by neurology. Keep sleep study appt. Will extend FMLA to cover the new testing. Follow-up 3 weeks for return to work.        Other Visit Diagnoses    Elevated glucose level       Ordered hemoglobin A1c for follow-up labs.    Relevant Orders   HgB A1c      Meds ordered this encounter  Medications  . hydrochlorothiazide (HYDRODIURIL) 12.5 MG tablet    Sig: TK 1 T PO D    Refill:  3      Follow up plan: Return in about 3 weeks (around 09/09/2016), or if symptoms worsen or fail to improve.

## 2016-08-19 NOTE — Telephone Encounter (Signed)
Called HR rep to determine how to extend his FMLA.

## 2016-08-19 NOTE — Assessment & Plan Note (Signed)
Controlled on recheck. Consider adding ACE to his Inderal if diastolic remain high. Ideally BP will decrease if sleep apnea is treated and diagnosed.

## 2016-08-19 NOTE — Assessment & Plan Note (Signed)
Some mild R sided weakness. Discussed option of OT/PT with the patient for weakness and feeling fatigued when using that side. He is amenable to an OT/PT consult. Prescription given for [employer] today.

## 2016-08-19 NOTE — Patient Instructions (Addendum)
We will keep you out of work until Mid-October to allow time to get your tests completed.  Let's plan to check back in mid October to determine if you are ready to go back to work.   Please get labs done before your appt.

## 2016-08-19 NOTE — Assessment & Plan Note (Addendum)
Awaiting MRI and sleep study as ordered by neurology. Pt encourage to take amitriptyline as prescribed by neurology. Keep sleep study appt. Will extend FMLA to cover the new testing. Follow-up 3 weeks for return to work.

## 2016-08-20 ENCOUNTER — Encounter: Payer: Self-pay | Admitting: Family Medicine

## 2016-08-20 NOTE — Telephone Encounter (Signed)
Received email follow-up from HR.

## 2016-08-24 ENCOUNTER — Encounter: Payer: Self-pay | Admitting: Family Medicine

## 2016-08-24 ENCOUNTER — Telehealth: Payer: Self-pay | Admitting: Family Medicine

## 2016-08-24 NOTE — Telephone Encounter (Signed)
Spoke with patient. It was sent on Friday. Have reached out to HR contact to confirm they received it.

## 2016-08-24 NOTE — Telephone Encounter (Signed)
Pt asked if his FMLA had been extended.  He has not been contacted by HR but his boss has been messaging him for the information.  His call back number is [phone]

## 2016-08-26 ENCOUNTER — Telehealth: Payer: Self-pay | Admitting: Cardiology

## 2016-08-26 NOTE — Telephone Encounter (Signed)
Attempted to contact pt regarding stress echo instructions. No answer, no voice mail.

## 2016-08-27 ENCOUNTER — Ambulatory Visit (INDEPENDENT_AMBULATORY_CARE_PROVIDER_SITE_OTHER): Payer: BLUE CROSS/BLUE SHIELD

## 2016-08-27 ENCOUNTER — Other Ambulatory Visit: Payer: Self-pay

## 2016-08-27 ENCOUNTER — Other Ambulatory Visit: Payer: BLUE CROSS/BLUE SHIELD

## 2016-08-27 DIAGNOSIS — R0789 Other chest pain: Secondary | ICD-10-CM

## 2016-08-27 LAB — ECHOCARDIOGRAM STRESS TEST
CHL CUP MPHR: 171 {beats}/min
CSEPEW: 12.1 METS
CSEPPHR: 151 {beats}/min
Exercise duration (min): 10 min
Exercise duration (sec): 15 s
Percent HR: 88 %
Rest HR: 76 {beats}/min

## 2016-08-31 ENCOUNTER — Telehealth: Payer: Self-pay | Admitting: Neurology

## 2016-08-31 NOTE — Telephone Encounter (Signed)
Hey Dr. Frances FurbishAthar I am working on Brandon Mullins's MR Brain [id number] and the insurance company is requiring a peer to peer. I was working on this last week and [employer]the insurance company said it was needing a MD review on their end and they said they tried to get a hold of us but I never received anything.  The best number to contact them is [phone] and their member ID number is [id number]. They also said it expires today at 4 pm.

## 2016-09-09 ENCOUNTER — Ambulatory Visit (INDEPENDENT_AMBULATORY_CARE_PROVIDER_SITE_OTHER): Payer: BLUE CROSS/BLUE SHIELD | Admitting: Family Medicine

## 2016-09-09 ENCOUNTER — Telehealth: Payer: Self-pay | Admitting: Family Medicine

## 2016-09-09 ENCOUNTER — Encounter: Payer: Self-pay | Admitting: Family Medicine

## 2016-09-09 VITALS — BP 120/92 | HR 77 | Temp 98.0°F | Resp 16 | Ht 70.0 in | Wt 146.0 lb

## 2016-09-09 DIAGNOSIS — M545 Low back pain, unspecified: Secondary | ICD-10-CM

## 2016-09-09 DIAGNOSIS — G44229 Chronic tension-type headache, not intractable: Secondary | ICD-10-CM

## 2016-09-09 DIAGNOSIS — I10 Essential (primary) hypertension: Secondary | ICD-10-CM | POA: Diagnosis not present

## 2016-09-09 DIAGNOSIS — Z23 Encounter for immunization: Secondary | ICD-10-CM

## 2016-09-09 MED ORDER — TIZANIDINE HCL 2 MG PO TABS: 2.0000 mg | ORAL_TABLET | Freq: Two times a day (BID) | ORAL | 2 refills | Status: DC | PRN

## 2016-09-09 MED ORDER — HYDROCHLOROTHIAZIDE 25 MG PO TABS: 25.0000 mg | ORAL_TABLET | Freq: Every day | ORAL | 5 refills | Status: DC

## 2016-09-09 MED ORDER — IBUPROFEN 600 MG PO TABS: 600.0000 mg | ORAL_TABLET | Freq: Three times a day (TID) | ORAL | 0 refills | Status: AC | PRN

## 2016-09-09 NOTE — Assessment & Plan Note (Signed)
Continue with plans for sleep study as recommended by neurology. Continue PRN muscle relaxers and propranolol.  Alarm symptoms reviewed. Released back to work with restrictions.

## 2016-09-09 NOTE — Assessment & Plan Note (Signed)
Diastolic remains high. Increase HCTZ today to 25 mg once daily to help control. Recommend close monitoring of BP at home. Blood pressure recheck 4 weeks and plan to check BMP at that time.

## 2016-09-09 NOTE — Progress Notes (Signed)
Subjective:    Patient ID: Brandon Mullins, male    DOB: 02-11-1966, 50 y.o.   MRN: [id number]  HPI: Brandon Mullins is a 50 y.o. male presenting on 09/09/2016 for Hypertension and Back Pain ( obtw onset 2 days having pain has heating pad not improving )   HPI  Pt presents for follow-up of hypertension, headaches, and back pain. He has been on FMLA for medical testing work-up of headaches given his past history of stroke. He would like to go back to work at this time. He wants to try to work from home due to his headaches being provoked by stress.  Pt is also reporting back pain today. Working out and was doing weight lifting with his legs- next day noticed L sided low back pain. Hurts to bend over. Using heating pad and ice pack. Taking zanaflex at night to help with pain. Feeling better this morning. Prolonged sitting hurts his back. No numbness or tingling in legs. No saddle anesthesia. No incontinence.   Past Medical History:  Diagnosis Date  . Anxiety   . Depression   . Erectile dysfunction   . Frequent headaches   . History of chicken pox   . HIV infection (HCC)   . Hyperlipidemia   . Hypertension   . Murmur, heart    as child  . Seasonal allergies   . Stroke (HCC)    x2  . Ulcerative colitis (HCC)     Current Outpatient Prescriptions on File Prior to Visit  Medication Sig  . amitriptyline (ELAVIL) 25 MG tablet 1/2 pill each bedtime x 1 week, then 1 pill nightly x 1 week, then 1 pill nightly thereafter.  Marland Kitchen. aspirin EC 325 MG tablet Take 325 mg by mouth daily.  Marland Kitchen. atorvastatin (LIPITOR) 20 MG tablet Take 20 mg by mouth daily.  . citalopram (CELEXA) 20 MG tablet Take 1 tablet (20 mg total) by mouth daily.  Marland Kitchen. EPZICOM 600-300 MG tablet Take 1 tablet by mouth daily.  . fluticasone (FLONASE) 50 MCG/ACT nasal spray Place 2 sprays into the nose as needed.  . propranolol ER (INDERAL LA) 120 MG 24 hr capsule Take 1 capsule (120 mg total) by mouth daily.  . SUSTIVA 600 MG tablet Take 1  tablet by mouth daily.   No current facility-administered medications on file prior to visit.     Review of Systems  Constitutional: Negative for chills and fever.  Respiratory: Negative for chest tightness, shortness of breath and wheezing.   Cardiovascular: Negative for chest pain, palpitations and leg swelling.  Gastrointestinal: Negative for abdominal pain, diarrhea, nausea and vomiting.  Genitourinary: Negative for flank pain and urgency.  Musculoskeletal: Positive for back pain. Negative for gait problem, neck pain and neck stiffness.  Skin: Negative for rash and wound.  Neurological: Positive for headaches. Negative for dizziness, weakness and numbness.   Per HPI unless specifically indicated above     Objective:    BP (!) 120/92 (BP Location: Left Arm)   Pulse 77   Temp 98 F (36.7 C) (Oral)   Resp 16   Ht 5\' 10"  (1.778 m)   Wt 146 lb (66.2 kg)   BMI 20.95 kg/m   Wt Readings from Last 3 Encounters:  09/09/16 146 lb (66.2 kg)  08/19/16 146 lb (66.2 kg)  08/12/16 144 lb (65.3 kg)    Physical Exam  Constitutional: He is oriented to person, place, and time. He appears well-developed and well-nourished. No distress.  HENT:  Head: Normocephalic and atraumatic.  Neck: Neck supple. No thyromegaly present.  Cardiovascular: Normal rate, regular rhythm and normal heart sounds.  Exam reveals no gallop and no friction rub.   No murmur heard. Pulmonary/Chest: Effort normal and breath sounds normal. He has no wheezes.  Abdominal: Soft. Bowel sounds are normal. He exhibits no distension. There is no tenderness. There is no rebound.  Musculoskeletal: He exhibits no edema.       Right shoulder: He exhibits decreased range of motion (2/2 pain- decreased flexion), tenderness and pain. He exhibits no bony tenderness, no swelling, no effusion, no crepitus and normal strength.       Lumbar back: He exhibits pain. He exhibits normal range of motion, no tenderness, no bony tenderness, no  swelling, no edema and no spasm.  Negative straight leg raise bilaterally.   Neurological: He is alert and oriented to person, place, and time. He has normal strength and normal reflexes. No cranial nerve deficit or sensory deficit. He exhibits normal muscle tone. Coordination and gait normal.  Reflex Scores:      Patellar reflexes are 2+ on the right side and 2+ on the left side. Skin: Skin is warm and dry. No rash noted. No erythema.  Psychiatric: He has a normal mood and affect. His behavior is normal. Thought content normal.   Results for orders placed or performed in visit on 08/27/16  ECHOCARDIOGRAM STRESS TEST  Result Value Ref Range   Rest HR 76 bpm   Rest BP 116/91 mmHg   Exercise duration (sec) 15 sec   Percent HR 88 %   Exercise duration (min) 10 min   Estimated workload 12.1 METS   Peak HR 151 bpm   Peak BP 154/89 mmHg   MPHR 171 bpm      Assessment & Plan:   Problem List Items Addressed This Visit      Cardiovascular and Mediastinum   BP (high blood pressure) - Primary    Diastolic remains high. Increase HCTZ today to 25 mg once daily to help control. Recommend close monitoring of BP at home. Blood pressure recheck 4 weeks and plan to check BMP at that time.       Relevant Medications   hydrochlorothiazide (HYDRODIURIL) 25 MG tablet     Other   Chronic daily headache    Continue with plans for sleep study as recommended by neurology. Continue PRN muscle relaxers and propranolol.  Alarm symptoms reviewed. Released back to work with restrictions.       Relevant Medications   tiZANidine (ZANAFLEX) 2 MG tablet   ibuprofen (ADVIL,MOTRIN) 600 MG tablet    Other Visit Diagnoses    Acute left-sided low back pain without sciatica       Likely muscle strain. Ice/ heat. PRN NSAIDs and muscle relaxants. Reviewed alarm symptoms. Consider PT if not improving.    Relevant Medications   tiZANidine (ZANAFLEX) 2 MG tablet   ibuprofen (ADVIL,MOTRIN) 600 MG tablet   Need  for influenza vaccination       Relevant Orders   Flu Vaccine QUAD 36+ mos PF IM (Fluarix & Fluzone Quad PF) (Completed)      Meds ordered this encounter  Medications  . tiZANidine (ZANAFLEX) 2 MG tablet    Sig: Take 1 tablet (2 mg total) by mouth 2 (two) times daily as needed for muscle spasms.    Dispense:  60 tablet    Refill:  2    Order Specific Question:   Supervising Provider  Answer:   Janeann Forehand [[id number]]  . hydrochlorothiazide (HYDRODIURIL) 25 MG tablet    Sig: Take 1 tablet (25 mg total) by mouth daily.    Dispense:  30 tablet    Refill:  5    Order Specific Question:   Supervising Provider    Answer:   Janeann Forehand [phone]  . ibuprofen (ADVIL,MOTRIN) 600 MG tablet    Sig: Take 1 tablet (600 mg total) by mouth every 8 (eight) hours as needed.    Dispense:  30 tablet    Refill:  0    Order Specific Question:   Supervising Provider    Answer:   Janeann Forehand [[id number]]      Follow up plan: Return in about 1 month (around 10/10/2016), or if symptoms worsen or fail to improve, for BP check. Marland Kitchen

## 2016-09-09 NOTE — Patient Instructions (Signed)
Blood pressure: We will increase your HCTZ to 25mg  today. Take 2 pills of your 12.5mg  until the bottle is empty.  Plan to check blood pressure in 1 mos.   Low back pain: Try zanaflex PRN for pain. Take motrin as needed to help for pain. Continue alternate heat and ice as needed. If you develop progressive numbness, weakness, loss of feeling in your pelvis, or loss of bowel/bladder control please seek immediate medical attention.

## 2016-09-09 NOTE — Telephone Encounter (Signed)
Pt asked for the return to work certification to be 10/23.  His call back number is [phone]

## 2016-09-13 NOTE — Telephone Encounter (Signed)
This has been denied due to the peer to peer not met. Please contact the  patient and let them know how to proceed from here.

## 2016-09-14 NOTE — Telephone Encounter (Signed)
I tried to call patient, no answer and vm was full.

## 2016-09-14 NOTE — Telephone Encounter (Signed)
Please advise patient that since his ins denied MRI brain, I would like to proceed with a CT head w and wo contrast. I placed an order for this. Also, he is scheduled for sleep study on 09/15/16 and we will call with results soon thereafter.

## 2016-09-14 NOTE — Addendum Note (Signed)
Addended by: Huston FoleyATHAR, Leeona Mccardle on: 09/14/2016 11:43 AM   Modules accepted: Orders

## 2016-09-16 NOTE — Telephone Encounter (Signed)
Tried to call patient, no answer and vm full. I will send patient a letter.

## 2016-09-17 ENCOUNTER — Encounter: Payer: Self-pay | Admitting: Cardiology

## 2016-09-17 ENCOUNTER — Encounter: Payer: Self-pay | Admitting: Neurology

## 2016-09-17 ENCOUNTER — Encounter: Payer: Self-pay | Admitting: Family Medicine

## 2016-09-21 ENCOUNTER — Telehealth: Payer: Self-pay | Admitting: Family Medicine

## 2016-09-21 NOTE — Telephone Encounter (Signed)
Pt. Called requesting someone to call him he have question about his letter to return to work,and  His [employer] paperwork

## 2016-09-21 NOTE — Telephone Encounter (Signed)
FMLA paperwork refaxed with documentation that was on 10/12 added.[place]

## 2016-09-21 NOTE — Telephone Encounter (Addendum)
Daron OfferJudy Shields with [employer] needs claification about the return to work certification for pt.  She asked who signed, asked about light duty and duration.  Her call back number is [phone]

## 2016-09-22 NOTE — Telephone Encounter (Signed)
Form completed and Dr. Althea CharonKaramalegos agreed and signed. Form has been faxed.[place]

## 2016-09-24 ENCOUNTER — Telehealth: Payer: Self-pay | Admitting: Neurology

## 2016-09-24 NOTE — Telephone Encounter (Signed)
The CT Head w/wo contrast was not approved by his insurance [employer]. You can do a peer to peer the phone number is [phone]. Insurance member ID is [id number] and their DOB is 1966/03/17 . Please do in 2 business days.

## 2016-09-27 ENCOUNTER — Telehealth: Payer: Self-pay | Admitting: Cardiology

## 2016-09-27 NOTE — Telephone Encounter (Signed)
LMOM for pt to call. (see email correspondence). To discuss release information process.

## 2016-10-13 ENCOUNTER — Ambulatory Visit: Payer: BLUE CROSS/BLUE SHIELD | Admitting: Family Medicine

## 2016-10-19 NOTE — Telephone Encounter (Signed)
Please arrange for FU appt. If HAs not better, we will have a better chance to get approval for MRI.

## 2016-10-20 NOTE — Telephone Encounter (Signed)
LM for patient with information below. I advised him to make appt with Dr. Frances FurbishAthar or NP.

## 2016-12-20 ENCOUNTER — Telehealth: Payer: Self-pay

## 2016-12-20 DIAGNOSIS — Z8673 Personal history of transient ischemic attack (TIA), and cerebral infarction without residual deficits: Secondary | ICD-10-CM

## 2016-12-20 DIAGNOSIS — B20 Human immunodeficiency virus [HIV] disease: Secondary | ICD-10-CM

## 2016-12-20 DIAGNOSIS — G44221 Chronic tension-type headache, intractable: Secondary | ICD-10-CM

## 2016-12-20 MED ORDER — AMITRIPTYLINE HCL 25 MG PO TABS: ORAL_TABLET | ORAL | 0 refills | Status: DC

## 2016-12-20 NOTE — Telephone Encounter (Signed)
90 day refill request

## 2017-01-25 ENCOUNTER — Other Ambulatory Visit: Payer: Self-pay | Admitting: Family Medicine

## 2017-01-25 DIAGNOSIS — G44229 Chronic tension-type headache, not intractable: Secondary | ICD-10-CM

## 2017-01-25 MED ORDER — TIZANIDINE HCL 2 MG PO TABS: 2.0000 mg | ORAL_TABLET | Freq: Two times a day (BID) | ORAL | 2 refills | Status: DC | PRN

## 2017-02-22 ENCOUNTER — Other Ambulatory Visit: Payer: Self-pay | Admitting: Family Medicine

## 2017-02-22 MED ORDER — NAPROXEN 375 MG PO TABS: 375.0000 mg | ORAL_TABLET | Freq: Two times a day (BID) | ORAL | 0 refills | Status: DC

## 2017-03-07 ENCOUNTER — Telehealth: Payer: Self-pay

## 2017-03-07 NOTE — Telephone Encounter (Signed)
Pt called three different times in November to schedule sleep study.

## 2017-03-11 ENCOUNTER — Other Ambulatory Visit: Payer: Self-pay | Admitting: Family Medicine

## 2017-03-22 ENCOUNTER — Other Ambulatory Visit: Payer: Self-pay

## 2017-03-22 DIAGNOSIS — I10 Essential (primary) hypertension: Secondary | ICD-10-CM

## 2017-03-22 MED ORDER — HYDROCHLOROTHIAZIDE 25 MG PO TABS: 25.0000 mg | ORAL_TABLET | Freq: Every day | ORAL | 0 refills | Status: DC

## 2017-03-22 NOTE — Telephone Encounter (Signed)
LMTCB

## 2017-03-22 NOTE — Telephone Encounter (Signed)
Pharmacy requesting refill Last ov 09/09/16  Last filled 09/09/16 Please review. Thank you. sd

## 2017-03-23 NOTE — Telephone Encounter (Signed)
LMTCB

## 2017-05-12 ENCOUNTER — Other Ambulatory Visit: Payer: Self-pay | Admitting: Neurology

## 2017-05-12 ENCOUNTER — Other Ambulatory Visit: Payer: Self-pay | Admitting: Family Medicine

## 2017-05-12 DIAGNOSIS — G44221 Chronic tension-type headache, intractable: Secondary | ICD-10-CM

## 2017-05-12 DIAGNOSIS — B20 Human immunodeficiency virus [HIV] disease: Secondary | ICD-10-CM

## 2017-05-12 DIAGNOSIS — Z8673 Personal history of transient ischemic attack (TIA), and cerebral infarction without residual deficits: Secondary | ICD-10-CM

## 2017-05-12 DIAGNOSIS — G44229 Chronic tension-type headache, not intractable: Secondary | ICD-10-CM

## 2017-05-12 NOTE — Telephone Encounter (Signed)
Pt needs an appt for further  refills 

## 2017-06-26 ENCOUNTER — Other Ambulatory Visit: Payer: Self-pay | Admitting: Family Medicine

## 2017-06-26 DIAGNOSIS — G44229 Chronic tension-type headache, not intractable: Secondary | ICD-10-CM

## 2018-03-24 IMAGING — CT CT HEAD W/O CM
1 series · 16 of 30 positions shown, 20 images · non-contrast
Comparison: None.

CLINICAL DATA: Left posterior headache, history of ischemic stroke

EXAM:
CT HEAD WITHOUT CONTRAST
TECHNIQUE: Contiguous axial images were obtained from the base of the skull
through the vertex without intravenous contrast.

[Series 2: head wo · axial · 0.40mm/px · z∈[-56,+79]mm · 16 of 30 slices shown, 20 images]
[im 2/30  brain]
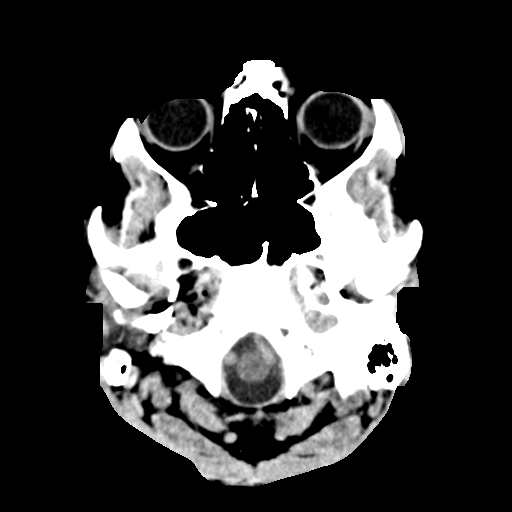
[im 2/30  bone]
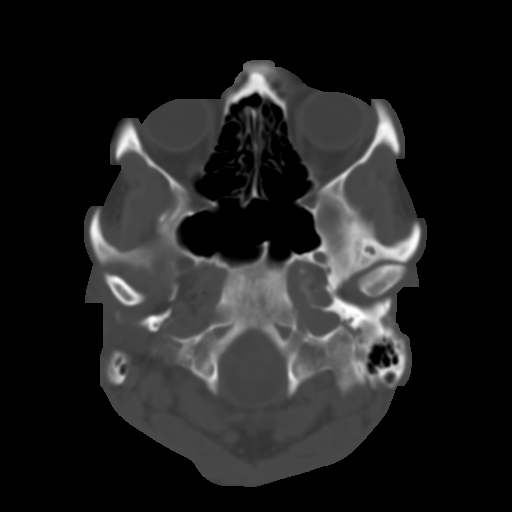
[im 4/30  brain]
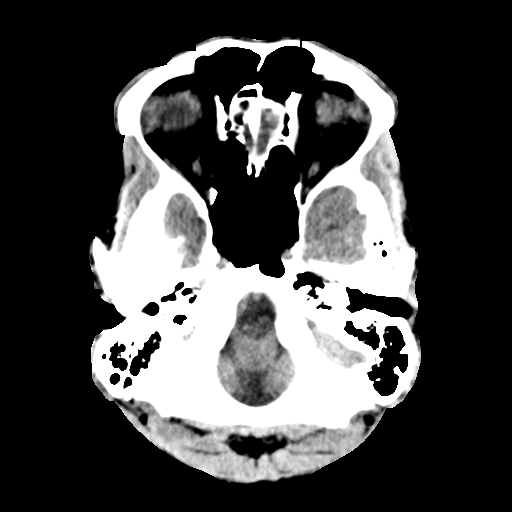
[im 6/30  brain]
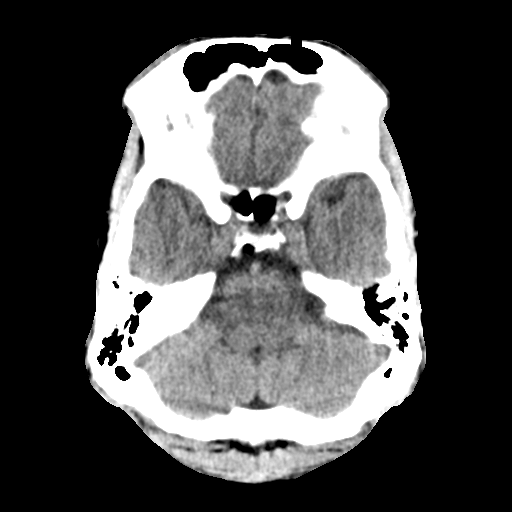
[im 8/30  brain]
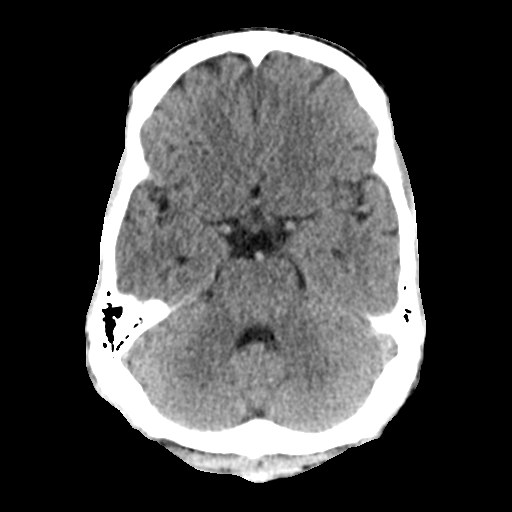
[im 9/30  brain]
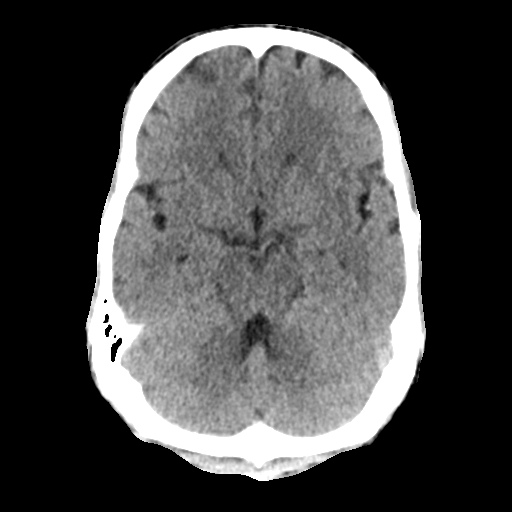
[im 9/30  bone]
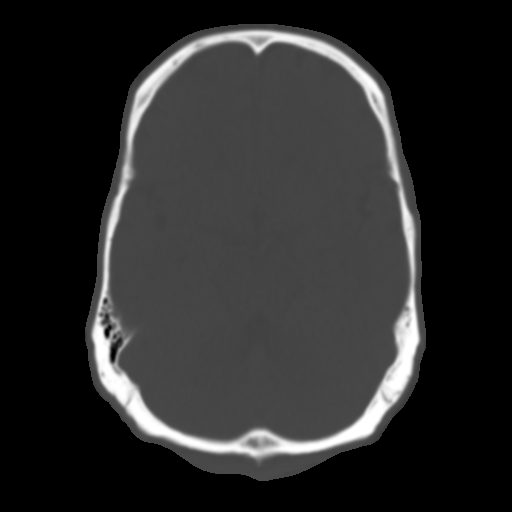
[im 11/30  brain]
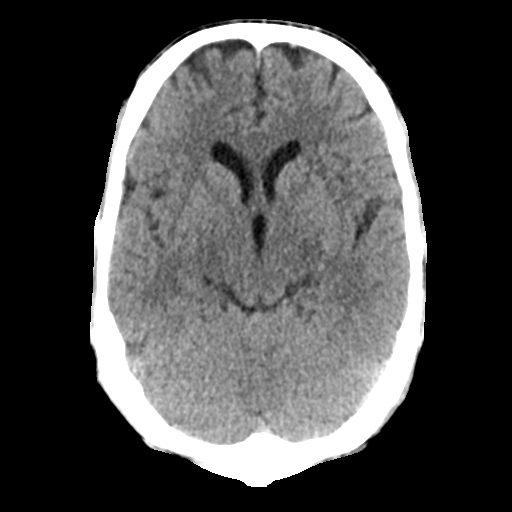
[im 13/30  brain]
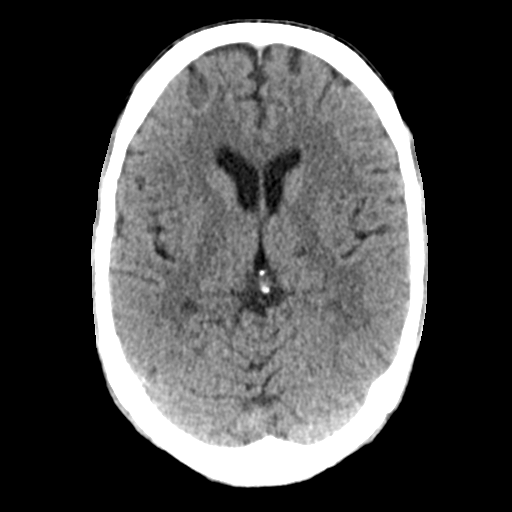
[im 15/30  brain]
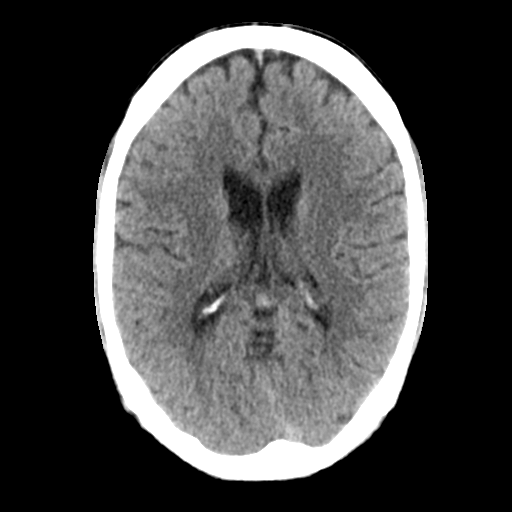
[im 16/30  brain]
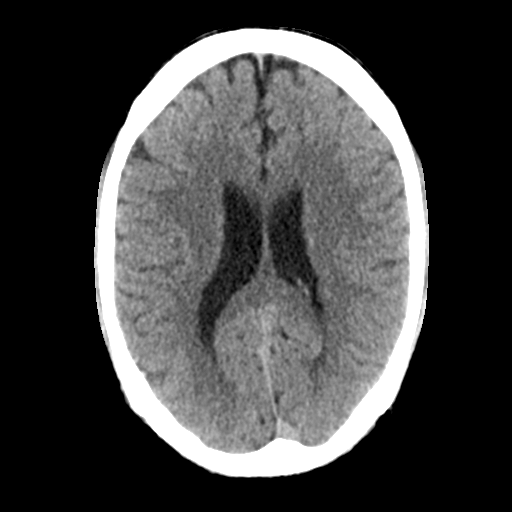
[im 16/30  bone]
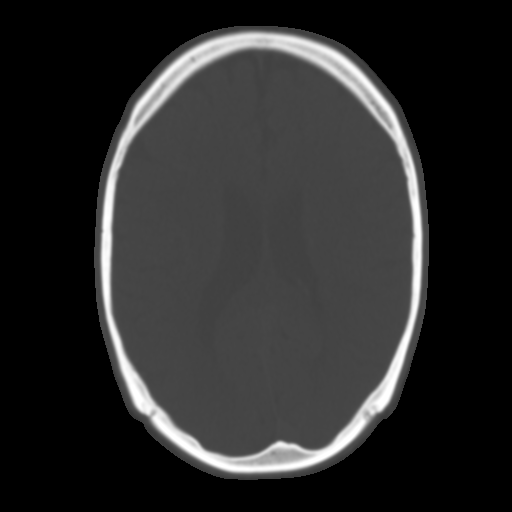
[im 18/30  brain]
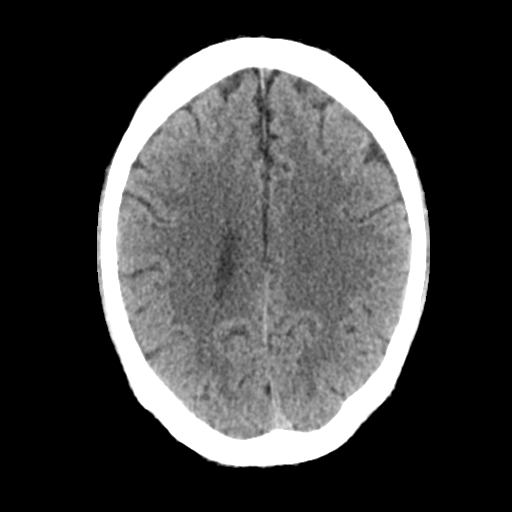
[im 20/30  brain]
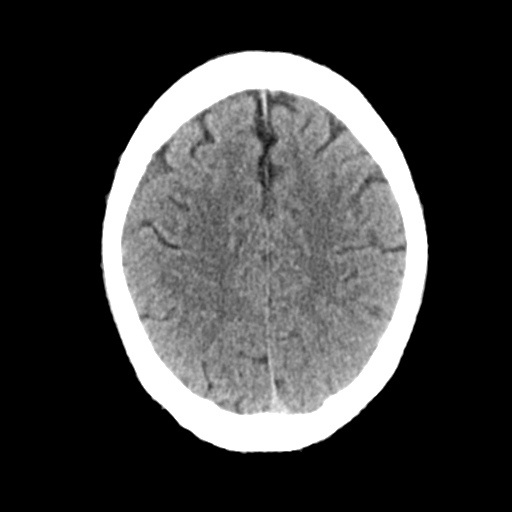
[im 22/30  brain]
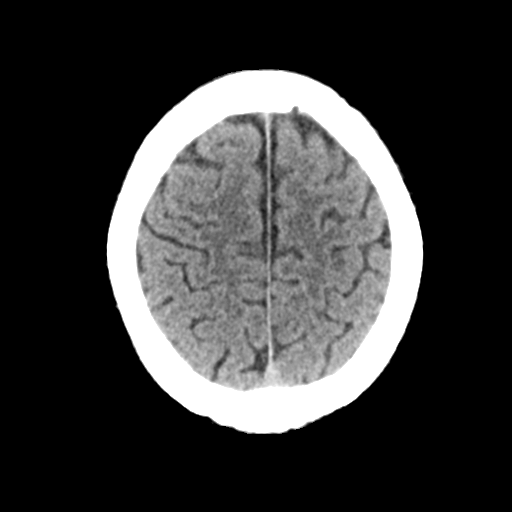
[im 23/30  brain]
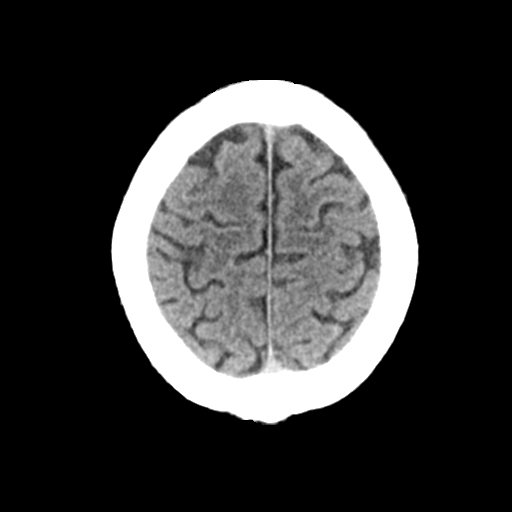
[im 23/30  bone]
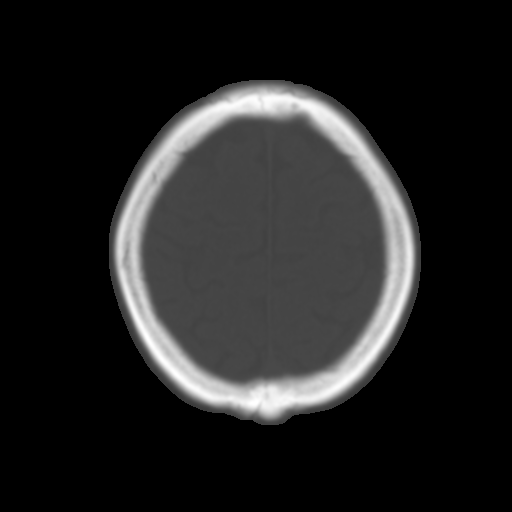
[im 25/30  brain]
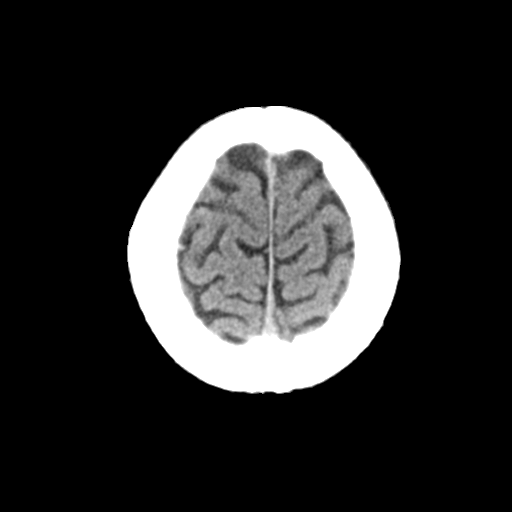
[im 27/30  brain]
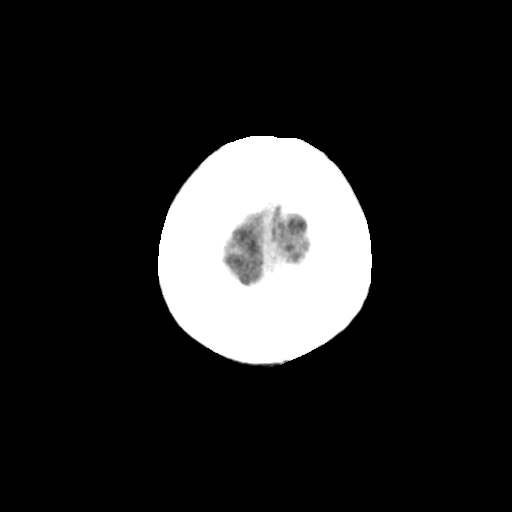
[im 29/30  brain]
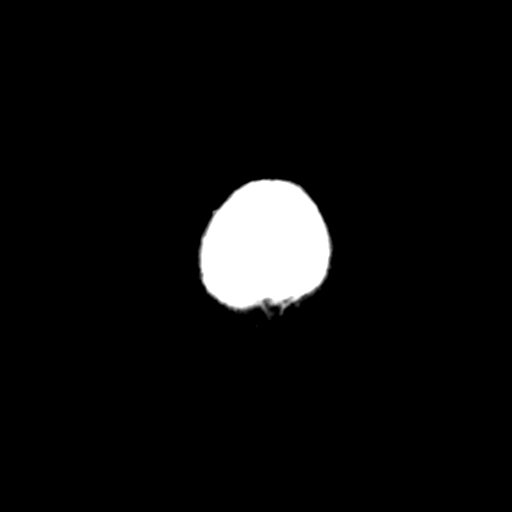

[16 of 30 positions shown; findings below may reference images not displayed]

FINDINGS: Brain: No intracranial hemorrhage, mass effect or midline shift. No
acute cortical infarction. No mass lesion is noted on this
unenhanced scan. No hydrocephalus.

Vascular: No hyperdense vessel or unexpected calcification.

Skull: Normal. Negative for fracture or focal lesion.

Sinuses/Orbits: No acute finding.

Other: None
IMPRESSION: No acute intracranial abnormality. No definite acute cortical
infarction.

## 2019-11-21 ENCOUNTER — Ambulatory Visit
Admission: EM | Admit: 2019-11-21 | Discharge: 2019-11-21 | Disposition: A | Discharge status: Home / Self Care | Payer: Self-pay

## 2019-11-21 ENCOUNTER — Encounter: Payer: Self-pay | Admitting: Emergency Medicine

## 2019-11-21 ENCOUNTER — Other Ambulatory Visit: Payer: Self-pay

## 2019-11-21 DIAGNOSIS — M7022 Olecranon bursitis, left elbow: Secondary | ICD-10-CM

## 2019-11-21 DIAGNOSIS — L03114 Cellulitis of left upper limb: Secondary | ICD-10-CM

## 2019-11-21 MED ORDER — SULFAMETHOXAZOLE-TRIMETHOPRIM 800-160 MG PO TABS: 1.0000 | ORAL_TABLET | Freq: Two times a day (BID) | ORAL | 0 refills | Status: AC

## 2019-11-21 MED ORDER — NAPROXEN 500 MG PO TABS: 500.0000 mg | ORAL_TABLET | Freq: Two times a day (BID) | ORAL | 0 refills | Status: AC

## 2019-11-21 NOTE — Discharge Instructions (Signed)
Take the antibiotic and naprosyn as directed.    Follow-up with your orthopedist or the one listed below in 1 week; call them to schedule an appointment.    Go to the emergency department if you have fever, chills, or signs of worsening infection including increased redness, increased swelling, increased pain, or other concerns.

## 2019-11-21 NOTE — ED Triage Notes (Signed)
Patient in office today c/o left elbow swelling red and sore. Patient state that this might be a insect bite was painful when it happened but now swollen  OTC: Ice, cortisone cream

## 2019-11-21 NOTE — ED Provider Notes (Signed)
Brandon Mullins    CSN: [id number] Arrival date & time: 11/21/19  1303      History   Chief Complaint Chief Complaint  Patient presents with  . Left elbow swollen    HPI Brandon Mullins is a 53 y.o. male.   Patient presents with left elbow redness and swelling x1 week.  He believes this may have started with an insect bite but is unsure.  He denies falls or injury.  He had a video visit with a medical provider and was treated with Keflex; he is on day 7 of this medication.  He reports moderate improvement of the redness and swelling since starting the Keflex.  He denies fever, chills, drainage from his elbow, other wounds, or other symptoms.  The history is provided by the patient.    Past Medical History:  Diagnosis Date  . Anxiety   . Depression   . Erectile dysfunction   . Frequent headaches   . History of chicken pox   . HIV infection ([place])   . Hyperlipidemia   . Hypertension   . Murmur, heart    as child  . Seasonal allergies   . Stroke ([place])    x2  . Ulcerative colitis [name])     Patient Active Problem List   Diagnosis Date Noted  . Chest tightness or pressure 04/22/2016  . ED (erectile dysfunction) of organic origin 03/26/2016  . Anxiety and depression 03/26/2016  . Chronic daily headache 03/26/2016  . Plantar fasciitis 03/26/2016  . HLD (hyperlipidemia) 10/09/2015  . H/O arterial ischemic stroke 03/15/2014  . BP (high blood pressure) 05/29/2012  . Ulcerative colitis ([name]) 05/29/2012  . Human immunodeficiency virus (HIV) infection ([name]) 08/29/2004    Past Surgical History:  Procedure Laterality Date  . HERNIA REPAIR         Home Medications    Prior to Admission medications   Medication Sig Start Date End Date Taking? Authorizing Provider  amLODipine (NORVASC) 10 MG tablet TAKE 1 TABLET BY MOUTH ONE TIME DAILY 07/25/19  Yes [provider]  losartan (COZAAR) 50 MG tablet TAKE 1 TABLET BY MOUTH ONE TIME DAILY 07/25/19  Yes [provider]  amitriptyline (ELAVIL) 25 MG tablet TAKE 1 PILL BY MOUTH NIGHTLY 05/12/17   Star Age, MD  aspirin EC 325 MG tablet Take 325 mg by mouth daily.    [provider]  atorvastatin (LIPITOR) 20 MG tablet Take 20 mg by mouth daily. 01/01/16   [provider]  citalopram (CELEXA) 20 MG tablet Take 1 tablet (20 mg total) by mouth daily. 03/26/16   Krebs, Amy Lauren, NP  EPZICOM 600-300 MG tablet Take 1 tablet by mouth daily. 01/01/16   [provider]  fluticasone (FLONASE) 50 MCG/ACT nasal spray Place 2 sprays into the nose as needed. 04/05/08   [provider]  hydrochlorothiazide (HYDRODIURIL) 25 MG tablet Take 1 tablet (25 mg total) by mouth daily. 03/22/17   Mikey College, NP  ibuprofen (ADVIL,MOTRIN) 600 MG tablet Take 1 tablet (600 mg total) by mouth every 8 (eight) hours as needed. 09/09/16   Krebs, Genevie Cheshire, NP  naproxen (NAPROSYN) 500 MG tablet Take 1 tablet (500 mg total) by mouth 2 (two) times daily. 11/21/19   Sharion Balloon, NP  propranolol ER (INDERAL LA) 120 MG 24 hr capsule Take 1 capsule (120 mg total) by mouth daily. 07/22/16   Luciana Axe, NP  sulfamethoxazole-trimethoprim (BACTRIM DS) 800-160 MG tablet Take 1  tablet by mouth 2 (two) times daily for 7 days. 11/21/19 11/28/19  Mickie Bailate, Meara Wiechman H, NP  SUSTIVA 600 MG tablet Take 1 tablet by mouth daily. 01/01/16   [provider]  tiZANidine (ZANAFLEX) 2 MG tablet TAKE 1 TABLET (2 MG TOTAL) BY MOUTH 2 (TWO) TIMES DAILY AS NEEDED FOR MUSCLE SPASMS. 05/12/17   Smitty CordsKaramalegos, Alexander J, DO    Family History Family History  Problem Relation Age of Onset  . AAA (abdominal aortic aneurysm) Mother   . Hypertension Mother   . Cancer Paternal Grandmother   . Diabetes Paternal Grandfather     Social History Social History   Tobacco Use  . Smoking status: Never Smoker  . Smokeless tobacco: Never Used  Substance Use Topics  . Alcohol use: Yes    Alcohol/week: 0.0 standard  drinks    Comment: occasional  . Drug use: No     Allergies   Patient has no known allergies.   Review of Systems Review of Systems  Constitutional: Negative for chills and fever.  HENT: Negative for ear pain and sore throat.   Eyes: Negative for pain and visual disturbance.  Respiratory: Negative for cough and shortness of breath.   Cardiovascular: Negative for chest pain and palpitations.  Gastrointestinal: Negative for abdominal pain and vomiting.  Genitourinary: Negative for dysuria and hematuria.  Musculoskeletal: Positive for arthralgias and joint swelling. Negative for back pain.  Skin: Negative for rash.  Neurological: Negative for seizures and syncope.  All other systems reviewed and are negative.    Physical Exam Triage Vital Signs ED Triage Vitals  Enc Vitals Group     BP 11/21/19 1315 (!) 142/93     Pulse Rate 11/21/19 1315 96     Resp 11/21/19 1315 18     Temp 11/21/19 1315 98.9 F (37.2 C)     Temp Source 11/21/19 1315 Oral     SpO2 11/21/19 1315 98 %     Weight 11/21/19 1319 150 lb (68 kg)     Height --      Head Circumference --      Peak Flow --      Pain Score 11/21/19 1319 0     Pain Loc --      Pain Edu? --      Excl. in GC? --    No data found.  Updated Vital Signs BP (!) 142/93 (BP Location: Right Arm)   Pulse 96   Temp 98.9 F (37.2 C) (Oral)   Resp 18   Wt 150 lb (68 kg)   SpO2 98%   BMI 21.52 kg/m   Visual Acuity Right Eye Distance:   Left Eye Distance:   Bilateral Distance:    Right Eye Near:   Left Eye Near:    Bilateral Near:     Physical Exam Vitals and nursing note reviewed.  Constitutional:      Appearance: He is well-developed.  HENT:     Head: Normocephalic and atraumatic.     Mouth/Throat:     Mouth: Mucous membranes are moist.     Pharynx: Oropharynx is clear.  Eyes:     Conjunctiva/sclera: Conjunctivae normal.  Cardiovascular:     Rate and Rhythm: Normal rate and regular rhythm.     Heart sounds: No  murmur.  Pulmonary:     Effort: Pulmonary effort is normal. No respiratory distress.     Breath sounds: Normal breath sounds.  Abdominal:     General: Bowel sounds are normal.  Palpations: Abdomen is soft.     Tenderness: There is no abdominal tenderness. There is no guarding or rebound.  Musculoskeletal:        General: Swelling and tenderness present. Normal range of motion.       Arms:     Cervical back: Neck supple.  Skin:    General: Skin is warm and dry.     Capillary Refill: Capillary refill takes less than 2 seconds.     Findings: Erythema and lesion present.     Comments: Left posterior elbow over the olecranon : mildly tender to palpation; mildly erythematous; moderately edematous; central healed 3 mm lesion; no drainage.    Neurological:     General: No focal deficit present.     Mental Status: He is alert and oriented to person, place, and time.     Sensory: No sensory deficit.     Motor: No weakness.     Gait: Gait normal.  Psychiatric:        Mood and Affect: Mood normal.        Behavior: Behavior normal.      UC Treatments / Results  Labs (all labs ordered are listed, but only abnormal results are displayed) Labs Reviewed - No data to display  EKG   Radiology No results found.  Procedures Procedures (including critical care time)  Medications Ordered in UC Medications - No data to display  Initial Impression / Assessment and Plan / UC Course  I have reviewed the triage vital signs and the nursing notes.  Pertinent labs & imaging results that were available during my care of the patient were reviewed by me and considered in my medical decision making (see chart for details).   Olecranon bursitis of left elbow; Cellulitis.  Treating with naprosyn and Septra DS. Instructed patient to follow up with Ortho next week if his symptoms are not improving.  Instructed him to go to the ED if he has fever, chills, increased edema, increased erythema,  increased pain, or other concerning symptoms.  Patient agrees to plan of care.     Final Clinical Impressions(s) / UC Diagnoses   Final diagnoses:  Olecranon bursitis of left elbow  Cellulitis of left upper extremity     Discharge Instructions     Take the antibiotic and naprosyn as directed.    Follow-up with your orthopedist or the one listed below in 1 week; call them to schedule an appointment.    Go to the emergency department if you have fever, chills, or signs of worsening infection including increased redness, increased swelling, increased pain, or other concerns.        ED Prescriptions    Medication Sig Dispense Auth. Provider   naproxen (NAPROSYN) 500 MG tablet Take 1 tablet (500 mg total) by mouth 2 (two) times daily. 30 tablet Mickie Bail, NP   sulfamethoxazole-trimethoprim (BACTRIM DS) 800-160 MG tablet Take 1 tablet by mouth 2 (two) times daily for 7 days. 14 tablet Mickie Bail, NP     PDMP not reviewed this encounter.   Mickie Bail, NP 11/21/19 [phone]

## 2019-12-03 ENCOUNTER — Other Ambulatory Visit: Payer: Self-pay

## 2019-12-03 ENCOUNTER — Ambulatory Visit (INDEPENDENT_AMBULATORY_CARE_PROVIDER_SITE_OTHER): Payer: No Typology Code available for payment source | Admitting: Family Medicine

## 2019-12-03 ENCOUNTER — Encounter: Payer: Self-pay | Admitting: Family Medicine

## 2019-12-03 VITALS — Wt 150.0 lb

## 2019-12-03 DIAGNOSIS — I10 Essential (primary) hypertension: Secondary | ICD-10-CM | POA: Diagnosis not present

## 2019-12-03 DIAGNOSIS — M7022 Olecranon bursitis, left elbow: Secondary | ICD-10-CM | POA: Insufficient documentation

## 2019-12-03 DIAGNOSIS — K5190 Ulcerative colitis, unspecified, without complications: Secondary | ICD-10-CM

## 2019-12-03 NOTE — Progress Notes (Signed)
I connected with Brandon Mullins on 12/03/19 at  9:40 AM EST by video and verified that I am speaking with the correct person using two identifiers.   I discussed the limitations, risks, security and privacy concerns of performing an evaluation and management service by video and the availability of in person appointments. I also discussed with the patient that there may be a patient responsible charge related to this service. The patient expressed understanding and agreed to proceed.  Patient location: Home Provider Location: [facility] Participants: Lesleigh Noe and Brandon Mullins   Subjective:     Brandon Mullins is a 54 y.o. male presenting for Establish Care (previous PCP with [facility]) and Elbow Pain (x 3 to 4 weeks. Left side. Has been seen at urgent care for this. )     HPI   #Elbow Pain - 3rd week of symptoms - saw urgent - was given abx and pain medication - was told it was bursitis - did attempt to drain - not an abscess - is able to move his arm - hx of elbow surgery on this arm when he was child  #HTN - losartan, amlodipine - no cp, sob, HA - has checked his BP at home, but it has been awhile - has been well controlled - it typically high at the doctors  #Anxiety/Depression - on citalopram  #HIV - sees ID with [facility]   #Ulcerative colitis - last saw someone in [place] - no current Bowel symptoms - would like referral - due for colonoscopy   Sickle cell trait carrier  Review of Systems  Constitutional: Negative for chills, fatigue and fever.  Musculoskeletal: Positive for joint swelling.     Social History   Tobacco Use  Smoking Status Never Smoker  Smokeless Tobacco Never Used        Objective:   BP Readings from Last 3 Encounters:  11/21/19 (!) 142/93  09/09/16 (!) 120/92  08/19/16 122/84   Wt Readings from Last 3 Encounters:  12/03/19 150 lb (68 kg)  11/21/19 150 lb (68 kg)  09/09/16 146 lb (66.2 kg)    Wt 150 lb (68 kg) Comment: per patient  BMI 21.52 kg/m    Physical Exam Constitutional:      Appearance: Normal appearance. He is not ill-appearing.  HENT:     Head: Normocephalic and atraumatic.     Right Ear: External ear normal.     Left Ear: External ear normal.  Eyes:     Conjunctiva/sclera: Conjunctivae normal.  Pulmonary:     Effort: Pulmonary effort is normal. No respiratory distress.  Musculoskeletal:     Comments: Normal elbow ROM Swelling over the olecranon of the left elbow. No erythema  Neurological:     Mental Status: He is alert. Mental status is at baseline.  Psychiatric:        Mood and Affect: Mood normal.        Behavior: Behavior normal.        Thought Content: Thought content normal.        Judgment: Judgment normal.             Assessment & Plan:   Problem List Items Addressed This Visit      Cardiovascular and Mediastinum   BP (high blood pressure)    Reports well controlled. Will continue to monitor. Continue current medication        Digestive   Ulcerative colitis ([place]) - Primary  Stable per patient, but overdue for colonoscopy. Not currently on any medication      Relevant Orders   Ambulatory referral to Gastroenterology     Musculoskeletal and Integument   Olecranon bursitis of left elbow    S/p abx and drainage w/o abscess. Pain is controlled and no fever. Will refer to ortho to consider steroid injection.       Relevant Orders   Ambulatory referral to Orthopedic Surgery       Return if symptoms worsen or fail to improve.  Lynnda Child, MD

## 2019-12-03 NOTE — Telephone Encounter (Signed)
[place] Any date/time

## 2019-12-03 NOTE — Assessment & Plan Note (Signed)
S/p abx and drainage w/o abscess. Pain is controlled and no fever. Will refer to ortho to consider steroid injection.

## 2019-12-03 NOTE — Assessment & Plan Note (Signed)
Stable per patient, but overdue for colonoscopy. Not currently on any medication

## 2019-12-03 NOTE — Assessment & Plan Note (Signed)
Reports well controlled. Will continue to monitor. Continue current medication

## 2022-07-27 ENCOUNTER — Ambulatory Visit
Admission: RE | Admit: 2022-07-27 | Discharge: 2022-07-27 | Disposition: A | Discharge status: Home / Self Care | Payer: 59 | Source: Ambulatory Visit | Attending: Family Medicine | Admitting: Family Medicine

## 2022-07-27 ENCOUNTER — Ambulatory Visit (INDEPENDENT_AMBULATORY_CARE_PROVIDER_SITE_OTHER): Payer: 59

## 2022-07-27 VITALS — BP 126/82 | HR 97 | Temp 98.2°F | Resp 14

## 2022-07-27 DIAGNOSIS — S6991XA Unspecified injury of right wrist, hand and finger(s), initial encounter: Secondary | ICD-10-CM

## 2022-07-27 DIAGNOSIS — S66911A Strain of unspecified muscle, fascia and tendon at wrist and hand level, right hand, initial encounter: Secondary | ICD-10-CM | POA: Diagnosis not present

## 2022-07-27 DIAGNOSIS — M79641 Pain in right hand: Secondary | ICD-10-CM

## 2022-07-27 DIAGNOSIS — M7989 Other specified soft tissue disorders: Secondary | ICD-10-CM

## 2022-07-27 MED ORDER — TIZANIDINE HCL 4 MG PO TABS: 4.0000 mg | ORAL_TABLET | Freq: Every day | ORAL | 0 refills | Status: DC

## 2022-07-27 MED ORDER — PREDNISONE 20 MG PO TABS: 40.0000 mg | ORAL_TABLET | Freq: Every day | ORAL | 0 refills | Status: AC

## 2022-07-27 NOTE — ED Triage Notes (Signed)
Patient c/o RT hand injury that occurred 4 days ago.   Patient endorses injury occurred by "hand getting caught in backpack when taking it off and I heard a pop".   Patient endorses swelling to dorsal side of hand.   Patient endorses is having difficulty with movement.   Patient endorses wrist and hand pain.   Patient has used ice with no relief of symptoms.

## 2022-07-27 NOTE — Discharge Instructions (Addendum)
X-ray findings show no fracture involving bones, however, unable to rule out a ligament tear as the source of wrist and hand  pain and swelling and you may warrant further work-up. Take prednisone daily for 5 days. Tizanidine 4 mg at bedtime as needed for pain this will help with rest.  Wear spica splint for comfort as needed .  You can remove for typing and bathing.  However I do recommend wearing most hours of the day until swelling and pain resolves.  If your symptoms do not completely resolve after prescribed medication follow-up with [facility].

## 2022-07-27 NOTE — ED Provider Notes (Signed)
Brandon Mullins    CSN: [id number] Arrival date & time: 07/27/22  1516      History   Chief Complaint Chief Complaint  Patient presents with   Hand Injury   APPT 1530    HPI Brandon Mullins is a 56 y.o. male.   HPI Patient presents today with a 4-day history of hand swelling and swelling and pain related to an injury sustained 4 days ago.  He reports he was removing a backpack from his back and heard either his right hand or right wrist pop.  He immediately experienced pain however over the last 4 days he has had increasing swelling this, decreased range of motion involving his right hand and wrist.  No specific area of distinct bruising however he is unable to grip with his right hand he has pain with fully extending his right hand and is unable to invert or evert at the wrist level of the left hand. He has iced, elevated, and worn a wrist brace while at home without improvement of pain and swelling has worsened.  Past Medical History:  Diagnosis Date   Anxiety    Depression    Erectile dysfunction    Frequent headaches    History of chicken pox    HIV infection (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 Authorizing Provider  abacavir-dolutegravir-lamiVUDine (TRIUMEQ) 600-50-300 MG tablet Take 1 tablet by mouth daily. 11/01/19  Yes [provider]  amitriptyline (ELAVIL) 25 MG tablet TAKE 1 PILL BY MOUTH NIGHTLY 05/12/17  Yes Star Age, MD  amLODipine (NORVASC) 10 MG tablet TAKE 1 TABLET BY MOUTH ONE TIME DAILY 07/25/19  Yes [provider]  atorvastatin (LIPITOR) 20 MG tablet Take 20 mg by mouth daily. 01/01/16  Yes [provider]  citalopram (CELEXA) 20 MG tablet Take 1 tablet (20 mg total) by mouth daily. 03/26/16  Yes Krebs, Amy Lauren, NP  losartan (COZAAR) 50 MG tablet TAKE 1 TABLET BY MOUTH ONE TIME DAILY 07/25/19  Yes [provider]  predniSONE (DELTASONE) 20 MG tablet Take 2 tablets (40 mg total) by mouth daily with breakfast. 07/27/22  Yes Scot Jun, FNP  tiZANidine (ZANAFLEX) 4 MG tablet Take 1 tablet (4 mg total) by mouth at bedtime. 07/27/22  Yes Scot Jun, FNP  aspirin EC 325 MG tablet Take 325 mg by mouth daily.    [provider]  fluticasone (FLONASE) 50 MCG/ACT nasal spray Place 2 sprays into the nose as needed.  04/05/08   [provider]  ibuprofen (ADVIL,MOTRIN) 600 MG tablet Take 1 tablet (600 mg total) by mouth every 8 (eight) hours as needed. 09/09/16   Krebs, Laurel Dimmer, NP  naproxen (NAPROSYN) 500 MG tablet Take 1 tablet (500 mg total) by mouth 2 (two) times daily. 11/21/19   Mickie Bail, NP    Family History Family History  Problem Relation Age of Onset   AAA (abdominal aortic aneurysm) Mother    Hypertension Mother    Sickle cell anemia Father    Ovarian cancer Paternal Grandmother    Hypertension Paternal Grandmother    Diabetes Paternal Grandfather    Hypertension Paternal Grandfather     Social History Social History   Tobacco Use   Smoking status: Never   Smokeless tobacco:  Never  Vaping Use   Vaping Use: Never used  Substance Use Topics   Alcohol use: Yes    Alcohol/week: 0.0 standard drinks of alcohol    Comment: 1 to 2 times a month-glass of wine   Drug use: No     Allergies   Patient has no known allergies.   Review of Systems Review of Systems Pertinent negatives listed in HPI   Physical Exam Triage Vital Signs ED Triage Vitals [07/27/22 1525]  Enc Vitals Group     BP 126/82     Pulse Rate 97     Resp 14     Temp 98.2 F (36.8 C)     Temp Source Oral     SpO2 98 %     Weight      Height      Head Circumference      Peak Flow      Pain Score 8     Pain Loc      Pain Edu?      Excl. in GC?    No data found.  Updated Vital Signs BP 126/82 (BP Location: Right Arm)   Pulse 97   Temp 98.2 F (36.8 C) (Oral)   Resp 14   SpO2 98%   Visual Acuity Right Eye Distance:   Left Eye Distance:   Bilateral Distance:    Right Eye Near:   Left Eye Near:    Bilateral Near:     Physical Exam Constitutional:      Appearance: Normal appearance.  Eyes:     Extraocular Movements: Extraocular movements intact.     Conjunctiva/sclera: Conjunctivae normal.     Pupils: Pupils are equal, round, and reactive to light.  Cardiovascular:     Rate and Rhythm: Normal rate and regular rhythm.  Pulmonary:     Effort: Pulmonary effort is normal.     Breath sounds: Normal breath sounds.  Musculoskeletal:       Arms:  Neurological:     General: No focal deficit present.     Mental Status: He is alert.  Psychiatric:        Attention and Perception: Attention normal.        Mood and Affect: Mood normal.        Speech: Speech normal.        Behavior: Behavior normal.    UC Treatments / Results  Labs (all labs ordered are listed, but only abnormal results are displayed) Labs Reviewed - No data to display  EKG   Radiology DG Wrist Complete Right  Result Date: 07/27/2022 CLINICAL DATA:  Right hand pain and swelling after injury. EXAM:  RIGHT WRIST - COMPLETE 3+ VIEW  COMPARISON:  None Available. FINDINGS: There is no evidence of fracture or dislocation. There is no evidence of arthropathy or other focal bone abnormality. Soft tissues are unremarkable. IMPRESSION: Negative. Electronically Signed   By: Lupita Raider M.D.   On: 07/27/2022 16:06   DG Hand Complete Right  Result Date: 07/27/2022 CLINICAL DATA:  Hand swelling from injury. EXAM: RIGHT HAND - COMPLETE 3+ VIEW COMPARISON:  None Available. FINDINGS: There is no evidence of fracture or dislocation. There is no evidence of arthropathy or other focal bone abnormality. There is some soft tissue swelling surrounding the wrist and over the dorsum of the hand. IMPRESSION: 1. No acute fracture or dislocation. 2. Soft tissue swelling of the hand and wrist. Electronically Signed   By: Darliss Cheney M.D.   On: 07/27/2022 16:05    Procedures Procedures (including critical care time)  Medications Ordered in UC Medications - No data to display  Initial Impression / Assessment and Plan / UC Course  I have reviewed the triage vital signs and the nursing notes.  Pertinent labs & imaging results that were available during my care of the patient were reviewed by me and considered in my medical decision making (see chart for details).    Wrist Strain, Right with swelling of right hand Imaging negative for acute fracture. Trial prednisone and tizanidine. Thumb/Wrist Spica Splint applied to Right Arm, Strict follow-up precautions given to follow-up with [facility] if symptoms worsen or do not improve.   Final Clinical Impressions(s) / UC Diagnoses   Final diagnoses:  Wrist strain, right, initial encounter  Swelling of right hand     Discharge Instructions      X-ray findings show no fracture involving bones, however, unable to rule out a ligament tear as the source of wrist and hand  pain and swelling and you may warrant further work-up. Take prednisone daily for 5 days.  Tizanidine 4 mg at bedtime as needed for pain this will help with rest.  Wear spica splint for comfort as needed .  You can remove for typing and bathing.  However I do recommend wearing most hours of the day until swelling and pain resolves.  If your symptoms do not completely resolve after prescribed medication follow-up with [facility].    ED Prescriptions     Medication Sig Dispense Auth. Provider   predniSONE (DELTASONE) 20 MG tablet Take 2 tablets (40 mg total) by mouth daily with breakfast. 10 tablet Bing Neighbors, FNP   tiZANidine (ZANAFLEX) 4 MG tablet Take 1 tablet (4 mg total) by mouth at bedtime. 20 tablet Bing Neighbors, FNP      PDMP not reviewed this encounter.   Bing Neighbors, FNP 07/30/22 [phone]

## 2022-09-16 ENCOUNTER — Other Ambulatory Visit: Payer: Self-pay | Admitting: Family Medicine

## 2022-12-14 ENCOUNTER — Ambulatory Visit: Payer: Self-pay

## 2022-12-14 ENCOUNTER — Ambulatory Visit
Admission: EM | Admit: 2022-12-14 | Discharge: 2022-12-14 | Disposition: A | Discharge status: Home / Self Care | Payer: 59 | Attending: Urgent Care | Admitting: Urgent Care

## 2022-12-14 DIAGNOSIS — R6889 Other general symptoms and signs: Secondary | ICD-10-CM

## 2022-12-14 MED ORDER — OSELTAMIVIR PHOSPHATE 75 MG PO CAPS: 75.0000 mg | ORAL_CAPSULE | Freq: Two times a day (BID) | ORAL | 0 refills | Status: AC

## 2022-12-14 NOTE — ED Provider Notes (Signed)
Brandon Mullins    CSN: [id number] Arrival date & time: 12/14/22  1757      History   Chief Complaint No chief complaint on file.   HPI Brandon Mullins is a 57 y.o. male.   HPI  Presents to urgent care with symptoms starting 2 days ago.  Symptoms including fever, chills, body aches, productive cough with yellow sputum, nasal congestion, diarrhea.  Patient has been using DayQuil/NyQuil to treat symptoms adequately.  He presents along with his wife who has nearly identical symptoms starting at approximately the same time.  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 Authorizing Provider  abacavir-dolutegravir-lamiVUDine (TRIUMEQ) 600-50-300 MG tablet Take 1 tablet by mouth daily. 11/01/19   [provider]  amitriptyline (ELAVIL) 25 MG tablet TAKE 1 PILL BY MOUTH NIGHTLY 05/12/17   Star Age, MD  amLODipine (NORVASC) 10 MG tablet TAKE 1 TABLET BY  MOUTH ONE TIME DAILY 07/25/19   [provider]  aspirin EC 325 MG tablet Take 325 mg by mouth daily.    [provider]  atorvastatin (LIPITOR) 20 MG tablet Take 20 mg by mouth daily. 01/01/16   [provider]  citalopram (CELEXA) 20 MG tablet Take 1 tablet (20 mg total) by mouth daily. 03/26/16   Krebs, Genevie Cheshire, NP  fluticasone (FLONASE) 50 MCG/ACT nasal spray Place 2 sprays into the nose as needed. 04/05/08   [provider]  ibuprofen (ADVIL,MOTRIN) 600 MG tablet Take 1 tablet (600 mg total) by mouth every 8 (eight) hours as needed. 09/09/16   Krebs, Genevie Cheshire, NP  losartan (COZAAR) 50 MG tablet TAKE 1 TABLET BY MOUTH ONE TIME DAILY 07/25/19   [provider]  naproxen (NAPROSYN) 500 MG tablet Take 1 tablet (500 mg total) by mouth 2 (two) times daily. 11/21/19   Sharion Balloon, NP  predniSONE (DELTASONE) 20 MG tablet Take 2 tablets (40 mg total) by mouth daily with breakfast. 07/27/22   Scot Jun, FNP  tiZANidine (ZANAFLEX) 4 MG tablet TAKE 1 TABLET BY MOUTH AT BEDTIME. 09/20/22   Kennyth Arnold, FNP    Family History Family History  Problem Relation Age of Onset   AAA (abdominal aortic aneurysm) Mother    Hypertension Mother    Sickle cell anemia Father  Ovarian cancer Paternal Grandmother    Hypertension Paternal Grandmother    Diabetes Paternal Grandfather    Hypertension Paternal Grandfather     Social History Social History   Tobacco Use   Smoking status: Never   Smokeless tobacco: Never  Vaping Use   Vaping Use: Never used  Substance Use Topics   Alcohol use: Yes    Alcohol/week: 0.0 standard drinks of alcohol    Comment: 1 to 2 times a month-glass of wine   Drug use: No     Allergies   Patient has no known allergies.   Review of Systems Review of Systems   Physical Exam Triage Vital Signs ED Triage Vitals  Enc Vitals Group     BP      Pulse      Resp      Temp      Temp src      SpO2      Weight       Height      Head Circumference      Peak Flow      Pain Score      Pain Loc      Pain Edu?      Excl. in GC?    No data found.  Updated Vital Signs There were no vitals taken for this visit.  Visual Acuity Right Eye Distance:   Left Eye Distance:   Bilateral Distance:    Right Eye Near:   Left Eye Near:    Bilateral Near:     Physical Exam Vitals reviewed.  Constitutional:      Appearance: Normal appearance.  HENT:     Mouth/Throat:     Mouth: Mucous membranes are moist.     Pharynx: No oropharyngeal exudate or posterior oropharyngeal erythema.  Cardiovascular:     Rate and Rhythm: Normal rate and regular rhythm.     Pulses: Normal pulses.     Heart sounds: Normal heart sounds.  Pulmonary:     Effort: Pulmonary effort is normal.     Breath sounds: Normal breath sounds.  Skin:    General: Skin is warm and dry.  Neurological:     General: No focal deficit present.     Mental Status: He is alert and oriented to person, place, and time.  Psychiatric:        Mood and Affect: Mood normal.        Behavior: Behavior normal.      UC Treatments / Results  Labs (all labs ordered are listed, but only abnormal results are displayed) Labs Reviewed - No data to display  EKG   Radiology No results found.  Procedures Procedures (including critical care time)  Medications Ordered in UC Medications - No data to display  Initial Impression / Assessment and Plan / UC Course  I have reviewed the triage vital signs and the nursing notes.  Pertinent labs & imaging results that were available during my care of the patient were reviewed by me and considered in my medical decision making (see chart for details).   Patient is afebrile here without recent antipyretics. Satting well on room air. Overall is well appearing, well hydrated, without respiratory distress. Pulmonary exam is unremarkable.  Lungs CTAB without wheezing, rhonchi, rales.  Pharyngeal erythema or  peritonsillar exudates.  Patient's symptoms are consistent with an acute viral process including influenza.  Given symptoms are within the treatment window for flu, will give antiviral therapy.  Otherwise recommending OTC medication  for symptom control.  Final Clinical Impressions(s) / UC Diagnoses   Final diagnoses:  None   Discharge Instructions   None    ED Prescriptions   None    PDMP not reviewed this encounter.   Rose Phi, [place] 12/14/22 [phone]

## 2022-12-14 NOTE — ED Triage Notes (Signed)
Pt reports viral s/s x 2 days. Body aches, congestion diarrhea.

## 2022-12-14 NOTE — ED Triage Notes (Signed)
Remainder of intake completed by provider.

## 2022-12-14 NOTE — Discharge Instructions (Addendum)
You have been diagnosed with a viral upper respiratory infection based on your symptoms and exam. Viral illnesses cannot be treated with antibiotics - they are self limiting - and you should find your symptoms resolving within a few days. Get plenty of rest and non-caffeinated fluids. Watch for signs of dehydration including reduced urine output and dark colored urine.  I have prescribed Tamiflu, antiviral therapy for influenza A, based on a presumptive diagnosis of influenza.  We recommend you use over-the-counter medications for symptom control including acetaminophen (Tylenol), ibuprofen (Advil/Motrin) or naproxen (Aleve) for throat pain, fever, chills or body aches. You may combine use of acetaminophen and ibuprofen/naproxen if needed.  Some patients find an pain-relieving throat spray such as Chloraseptic to be effective.  Also recommend cold/cough medication containing a cough suppressant such as dextromethorphan, as needed. Please note that some cough medications are not recommended if you suffer from hypertension.    Saline mist spray is helpful for removing excess mucus from your nose.  Room humidifiers are helpful to ease breathing at night. I recommend guaifenesin (Mucinex) with plenty of water throughout the day to help thin and loosen mucus secretions in your respiratory passages.   If appropriate based upon your other medical problems, you might also find relief of nasal/sinus congestion symptoms by using a nasal decongestant such as fluticasone (Flonase ) or pseudoephedrine (Sudafed sinus).  You will need to obtain Sudafed from behind the pharmacist counter.  Speak to the pharmacist to verify that you are not duplicating medications with other over-the-counter formulations that you may be using.     

## 2023-03-30 DEATH — deceased
